# Patient Record
Sex: Female | Born: 1955 | Race: White | Hispanic: No | Smoking: Never smoker
Health system: Southern US, Community
[De-identification: ages and names within clinical notes are randomized; demographics above are authoritative.]

## PROBLEM LIST (undated history)

## (undated) DIAGNOSIS — F32A Depression, unspecified: Secondary | ICD-10-CM

## (undated) DIAGNOSIS — R000 Tachycardia, unspecified: Secondary | ICD-10-CM

## (undated) DIAGNOSIS — E039 Hypothyroidism, unspecified: Secondary | ICD-10-CM

## (undated) DIAGNOSIS — E785 Hyperlipidemia, unspecified: Secondary | ICD-10-CM

## (undated) DIAGNOSIS — F419 Anxiety disorder, unspecified: Secondary | ICD-10-CM

## (undated) DIAGNOSIS — N3281 Overactive bladder: Secondary | ICD-10-CM

## (undated) DIAGNOSIS — I10 Essential (primary) hypertension: Secondary | ICD-10-CM

## (undated) DIAGNOSIS — E119 Type 2 diabetes mellitus without complications: Secondary | ICD-10-CM

## (undated) DIAGNOSIS — M1990 Unspecified osteoarthritis, unspecified site: Secondary | ICD-10-CM

## (undated) DIAGNOSIS — K589 Irritable bowel syndrome without diarrhea: Secondary | ICD-10-CM

## (undated) DIAGNOSIS — E669 Obesity, unspecified: Secondary | ICD-10-CM

## (undated) DIAGNOSIS — E786 Lipoprotein deficiency: Secondary | ICD-10-CM

## (undated) DIAGNOSIS — K219 Gastro-esophageal reflux disease without esophagitis: Secondary | ICD-10-CM

## (undated) DIAGNOSIS — F329 Major depressive disorder, single episode, unspecified: Secondary | ICD-10-CM

## (undated) DIAGNOSIS — T7840XA Allergy, unspecified, initial encounter: Secondary | ICD-10-CM

## (undated) DIAGNOSIS — G43909 Migraine, unspecified, not intractable, without status migrainosus: Secondary | ICD-10-CM

## (undated) HISTORY — PX: FRACTURE SURGERY: SHX138

## (undated) HISTORY — DX: Gastro-esophageal reflux disease without esophagitis: K21.9

## (undated) HISTORY — DX: Hypothyroidism, unspecified: E03.9

## (undated) HISTORY — DX: Irritable bowel syndrome, unspecified: K58.9

## (undated) HISTORY — DX: Allergy, unspecified, initial encounter: T78.40XA

## (undated) HISTORY — PX: TONSILLECTOMY: SUR1361

## (undated) HISTORY — PX: CHOLECYSTECTOMY: SHX55

## (undated) HISTORY — DX: Obesity, unspecified: E66.9

## (undated) HISTORY — PX: ABDOMINAL HYSTERECTOMY: SHX81

## (undated) HISTORY — DX: Major depressive disorder, single episode, unspecified: F32.9

## (undated) HISTORY — DX: Lipoprotein deficiency: E78.6

## (undated) HISTORY — DX: Overactive bladder: N32.81

## (undated) HISTORY — PX: BREAST BIOPSY: SHX20

## (undated) HISTORY — PX: BREAST EXCISIONAL BIOPSY: SUR124

## (undated) HISTORY — DX: Tachycardia, unspecified: R00.0

## (undated) HISTORY — DX: Type 2 diabetes mellitus without complications: E11.9

## (undated) HISTORY — DX: Migraine, unspecified, not intractable, without status migrainosus: G43.909

## (undated) HISTORY — DX: Depression, unspecified: F32.A

---

## 1998-10-03 ENCOUNTER — Other Ambulatory Visit: Admission: RE | Admit: 1998-10-03 | Discharge: 1998-10-03 | Payer: Self-pay | Admitting: Obstetrics & Gynecology

## 1998-11-30 ENCOUNTER — Ambulatory Visit (HOSPITAL_COMMUNITY): Admission: RE | Admit: 1998-11-30 | Discharge: 1998-11-30 | Payer: Self-pay | Admitting: Obstetrics & Gynecology

## 1998-11-30 ENCOUNTER — Encounter: Payer: Self-pay | Admitting: Obstetrics & Gynecology

## 1999-12-06 ENCOUNTER — Encounter: Payer: Self-pay | Admitting: Obstetrics & Gynecology

## 1999-12-06 ENCOUNTER — Ambulatory Visit (HOSPITAL_COMMUNITY): Admission: RE | Admit: 1999-12-06 | Discharge: 1999-12-06 | Payer: Self-pay | Admitting: Obstetrics & Gynecology

## 1999-12-17 ENCOUNTER — Encounter: Payer: Self-pay | Admitting: Obstetrics & Gynecology

## 1999-12-17 ENCOUNTER — Ambulatory Visit (HOSPITAL_COMMUNITY): Admission: RE | Admit: 1999-12-17 | Discharge: 1999-12-17 | Payer: Self-pay | Admitting: Obstetrics & Gynecology

## 1999-12-19 ENCOUNTER — Other Ambulatory Visit: Admission: RE | Admit: 1999-12-19 | Discharge: 1999-12-19 | Payer: Self-pay | Admitting: Obstetrics & Gynecology

## 1999-12-28 ENCOUNTER — Ambulatory Visit (HOSPITAL_COMMUNITY): Admission: RE | Admit: 1999-12-28 | Discharge: 1999-12-28 | Payer: Self-pay | Admitting: Obstetrics & Gynecology

## 1999-12-28 ENCOUNTER — Encounter (INDEPENDENT_AMBULATORY_CARE_PROVIDER_SITE_OTHER): Payer: Self-pay

## 1999-12-28 ENCOUNTER — Encounter: Payer: Self-pay | Admitting: Obstetrics & Gynecology

## 2000-01-02 ENCOUNTER — Encounter: Payer: Self-pay | Admitting: Obstetrics & Gynecology

## 2000-01-02 ENCOUNTER — Ambulatory Visit (HOSPITAL_COMMUNITY): Admission: RE | Admit: 2000-01-02 | Discharge: 2000-01-02 | Payer: Self-pay | Admitting: Obstetrics & Gynecology

## 2000-01-02 ENCOUNTER — Encounter (INDEPENDENT_AMBULATORY_CARE_PROVIDER_SITE_OTHER): Payer: Self-pay

## 2000-09-11 ENCOUNTER — Encounter (INDEPENDENT_AMBULATORY_CARE_PROVIDER_SITE_OTHER): Payer: Self-pay | Admitting: Specialist

## 2000-09-12 ENCOUNTER — Inpatient Hospital Stay (HOSPITAL_COMMUNITY): Admission: AD | Admit: 2000-09-12 | Discharge: 2000-09-14 | Payer: Self-pay | Admitting: Obstetrics & Gynecology

## 2000-12-18 ENCOUNTER — Encounter: Payer: Self-pay | Admitting: Obstetrics & Gynecology

## 2000-12-18 ENCOUNTER — Ambulatory Visit (HOSPITAL_COMMUNITY): Admission: RE | Admit: 2000-12-18 | Discharge: 2000-12-18 | Payer: Self-pay | Admitting: Obstetrics & Gynecology

## 2001-07-17 ENCOUNTER — Other Ambulatory Visit: Admission: RE | Admit: 2001-07-17 | Discharge: 2001-07-17 | Payer: Self-pay | Admitting: Obstetrics & Gynecology

## 2002-03-31 ENCOUNTER — Encounter: Payer: Self-pay | Admitting: Gastroenterology

## 2002-03-31 ENCOUNTER — Ambulatory Visit (HOSPITAL_COMMUNITY): Admission: RE | Admit: 2002-03-31 | Discharge: 2002-03-31 | Payer: Self-pay | Admitting: Gastroenterology

## 2002-08-12 ENCOUNTER — Other Ambulatory Visit: Admission: RE | Admit: 2002-08-12 | Discharge: 2002-08-12 | Payer: Self-pay | Admitting: Obstetrics & Gynecology

## 2002-09-02 ENCOUNTER — Encounter: Payer: Self-pay | Admitting: Obstetrics & Gynecology

## 2002-09-02 ENCOUNTER — Ambulatory Visit (HOSPITAL_COMMUNITY): Admission: RE | Admit: 2002-09-02 | Discharge: 2002-09-02 | Payer: Self-pay | Admitting: Obstetrics & Gynecology

## 2003-09-05 ENCOUNTER — Encounter: Payer: Self-pay | Admitting: Obstetrics & Gynecology

## 2003-09-05 ENCOUNTER — Ambulatory Visit (HOSPITAL_COMMUNITY): Admission: RE | Admit: 2003-09-05 | Discharge: 2003-09-05 | Payer: Self-pay | Admitting: Obstetrics & Gynecology

## 2003-10-25 ENCOUNTER — Other Ambulatory Visit: Admission: RE | Admit: 2003-10-25 | Discharge: 2003-10-25 | Payer: Self-pay | Admitting: Obstetrics & Gynecology

## 2004-09-18 ENCOUNTER — Ambulatory Visit (HOSPITAL_COMMUNITY): Admission: RE | Admit: 2004-09-18 | Discharge: 2004-09-18 | Payer: Self-pay | Admitting: Obstetrics & Gynecology

## 2005-06-27 ENCOUNTER — Other Ambulatory Visit: Admission: RE | Admit: 2005-06-27 | Discharge: 2005-06-27 | Payer: Self-pay | Admitting: Obstetrics & Gynecology

## 2005-10-25 ENCOUNTER — Ambulatory Visit (HOSPITAL_COMMUNITY): Admission: RE | Admit: 2005-10-25 | Discharge: 2005-10-25 | Payer: Self-pay | Admitting: Obstetrics & Gynecology

## 2006-10-28 ENCOUNTER — Ambulatory Visit (HOSPITAL_COMMUNITY): Admission: RE | Admit: 2006-10-28 | Discharge: 2006-10-28 | Payer: Self-pay | Admitting: Obstetrics & Gynecology

## 2007-11-18 ENCOUNTER — Ambulatory Visit (HOSPITAL_COMMUNITY): Admission: RE | Admit: 2007-11-18 | Discharge: 2007-11-18 | Payer: Self-pay | Admitting: Obstetrics & Gynecology

## 2008-11-22 ENCOUNTER — Ambulatory Visit (HOSPITAL_COMMUNITY): Admission: RE | Admit: 2008-11-22 | Discharge: 2008-11-22 | Payer: Self-pay | Admitting: Obstetrics & Gynecology

## 2009-11-23 ENCOUNTER — Ambulatory Visit (HOSPITAL_COMMUNITY): Admission: RE | Admit: 2009-11-23 | Discharge: 2009-11-23 | Payer: Self-pay | Admitting: Obstetrics & Gynecology

## 2009-11-30 ENCOUNTER — Encounter: Admission: RE | Admit: 2009-11-30 | Discharge: 2009-11-30 | Payer: Self-pay | Admitting: Obstetrics & Gynecology

## 2010-03-13 ENCOUNTER — Encounter: Admission: RE | Admit: 2010-03-13 | Discharge: 2010-03-13 | Payer: Self-pay | Admitting: Obstetrics & Gynecology

## 2010-11-12 ENCOUNTER — Encounter: Admission: RE | Admit: 2010-11-12 | Discharge: 2010-11-12 | Payer: Self-pay | Admitting: Obstetrics & Gynecology

## 2010-12-30 ENCOUNTER — Encounter: Payer: Self-pay | Admitting: Obstetrics & Gynecology

## 2011-04-26 NOTE — H&P (Signed)
[facility]  Patient:    Rachel Nixon, Rachel Nixon                        MRN: [id number] Adm. Date:  09/11/00 Attending:  Freddy Finner, M.D.                         History and Physical  ADMISSION DIAGNOSES:          1. Uterine prolapse.                               2. First-degree cystocele.                               3. First-degree rectocele.  HISTORY OF PRESENT ILLNESS:   The patient is a 55 year old white divorced female, gravida 1, para 1, who has been known to have uterine prolapse now for a significant amount of time.  She states that this has recently become significantly worse.  She feels that this interferes with her ability to empty her bladder, although she has no specific urinary incontinence.  On examination in the office she was found to have uterine prolapse.  She has a first-degree cystocele, first-degree rectocele.  She has requested definitive surgical treatment and is admitted now for total vaginal hysterectomy, anterior and posterior colporrhaphy, and possible sacrospinous ligament suspension.  She has definitively stated that if her ovaries are normal she wishes not to have these removed even though the potential benefits of reducing ovarian cancer risks have been discussed with her, as well as the fact that she is at an age when the ovaries have probably provided most of their useful function.  REVIEW OF SYSTEMS:            Current review of systems is otherwise negative, although she does have symptoms intermittently consistent with irritable bowel syndrome.  There are no cardiopulmonary symptoms.  No other GI symptoms.  PAST MEDICAL HISTORY:         No known significant medical illnesses.  She has had a previous benign breast biopsy.  She has had a tonsillectomy in 1975. She had a vaginal delivery in 1982.  She has had cryocautery of the cervix for cervical dysplasia in 1990, but has had normal Pap smears since that time. She  has never had a blood transfusion.  ALLERGIES:                    ERYTHROMYCIN.  No other known drug allergies.  SOCIAL HISTORY:               She is not a cigarette smoker.  FAMILY HISTORY:               Noncontributory.  PHYSICAL EXAMINATION:  HEENT:                        Grossly within normal limits.  NECK:                         Thyroid gland is not palpably enlarged.  VITAL SIGNS:                  Blood pressure in the office was 90/60.  BREASTS:  Considered to be normal.  There are no palpable masses, no skin change, no nipple discharge.  HEART:                        Normal sinus rhythm without murmurs, rubs, or gallops.  CHEST:                        Clear to auscultation.  ABDOMEN:                      Soft and nontender.  Without appreciable organomegaly or palpable masses.  PELVIC:                       As described above.  Bimanual reveals the uterus to be slightly enlarged, but there are no adnexal masses.  RECTAL:                       Palpably normal, and rectovaginal exam confirms these findings.  ASSESSMENT:                   Uterine prolapse, cystocele, rectocele.  PLAN:                         Total vaginal hysterectomy, possible bilateral salpingo-oophorectomy, possible sacrospinous ligament suspension, and definite anterior and posterior colporrhaphy.  The patient has reviewed a video in the office describing these operative procedures and is prepared to proceed with surgery. DD:  09/10/00 TD:  09/10/00 Job: [id number]

## 2011-04-26 NOTE — Procedures (Signed)
[facility]  Patient:    BRECKEN, WALTH Visit Number: [id number] MRN: [id number]          Service Type: END Location: ENDO Attending Physician:  Nelda Marseille Dictated by:   Petra Kuba, M.D. Proc. Date: 03/31/02 Admit Date:  03/31/2002   CC:         Jethro Bastos, M.D.  Josetta Huddle, DMD, M.D.   Procedure Report  PROCEDURE:  Colonoscopy with foreign body removal.  INDICATION:  Patient with a crown in her abdomen not moving on multiple serial x-rays.  Consent was signed after risks, benefits, methods, and options thoroughly discussed in the office.  MEDICATIONS:  Demerol 100, Versed 10.  DESCRIPTION OF PROCEDURE:  Rectal inspection was pertinent for small external hemorrhoids.  Digital exam was negative.  The pediatric video adjustable colonoscope was inserted and despite a tortuous colon, advanced to the level of the ileocecal valve.  This did require abdominal pressure but no position changes.  We could see the crown sitting where we thought was the cecal pole. We went ahead and advanced the [facility] retrieval basket which easily grabbed this crown, and we slowly withdrew the scope with the crown in the basket slightly in the distance.  We were able to see some of the mucosa but could not suction any of the fluid and when we fell back around a loop of the colon, did not try to readvanced.  On slow withdrawal back to the rectum, no abnormalities were seen.  The scope and the basket and crown were removed.  The crown was recovered.  We went ahead and reinserted the scope and easily advanced to 70 cm.  At that point, the scope began to loop.  We were in the proximal level of the splenic flexure, and we elected to slowly withdraw.  No abnormalities were seen on slow withdrawal of the left side of the colon.  Once back in the rectum, the scope was retroflexed, revealing some tiny internal hemorrhoids. The scope was straightened, air was  suctioned, and the scope removed.  The patient tolerated the procedure well.  There was no obvious immediate complication.  ENDOSCOPIC DIAGNOSES: 1. Internal and external hemorrhoids, small to tiny. 2. Crown in the cecum, status post removal with the [employer]. 3. Tortuous colon but no abnormalities seen on insertion and none on the left    side of the colon in a repeat look.  PLAN:  Happy to see back p.r.n.  Return care to Dr. Dorothe Pea for the customary health care maintenance to include yearly rectals and guaiacs.  Repeat screening probably age 55. Dictated by:   Petra Kuba, M.D. Attending Physician:  Nelda Marseille DD:  03/31/02 TD:  03/31/02 Job: [id number]

## 2011-04-26 NOTE — Op Note (Signed)
[facility]  Patient:    Rachel Nixon, Rachel Nixon                      MRN: [id number] Proc. Date: 09/11/00 Adm. Date:  [id number] Attending:  Minette Headland                           Operative Report  PREOPERATIVE DIAGNOSES:       Uterine prolapse, cystocele and rectocele.  POSTOPERATIVE DIAGNOSES:      Uterine prolapse, cystocele and rectocele.  OPERATIVE PROCEDURE:          Total vaginal hysterectomy, anterior and posterior colporrhaphy.  SURGEON:                      Freddy Finner, M.D.  ASSISTANT:                    Trevor Iha, M.D.  ESTIMATED INTRAOPERATIVE BLOOD LOSS:  200 cc.  ANESTHESIA:                   General endotracheal.  INTRAOPERATIVE COMPLICATIONS:  None.  INDICATIONS:                  Details of the present illness are recorded in the admission note.  DESCRIPTION OF PROCEDURE:     Patient was admitted on the morning of surgery. She was given a bolus of Cefotan IV.  She was placed in PAS hose.  She was brought to the operating room and there, placed under adequate general endotracheal anesthesia and placed in the dorsal lithotomy position using the [place] stirrup system.  Betadine prep of perineum, medial thighs, lower abdomen and vagina was carried out with Betadine scrub, followed by a Betadine solution.  Sterile drapes were applied.  A posterior weighted vaginal retractor was placed.  Cervix was grasped with a Christella Hartigan tenaculum.  Cul-de-sac was tented with an Allis clamp and incised with Mayo scissors.  Peritoneum was entered posteriorly.  Cervix was circumscribed with a scalpel to release the mucosa from the cervix.  Curved Heaneys were used to develop the uterosacral pedicles, which were cross-clamped, divided sharply and ligated with 0 Vicryl in a Heaney fashion.  The bladder pillars were developed separately, cross-clamped, divided and ligated with 0 Monocryl.  Anterior peritoneum was entered.  Cardinal ligament  pedicles were taken with curved Heaneys on each side, divided sharply and ligated with 0 Vicryl.  Vascular pedicles were taken, divided sharply and ligated with 0 Monocryl.  Uterus was delivered through the vaginal introitus.  Near ovarian pedicles on each side were cross-clamped with Heaneys, divided sharply and the uterus removed.  Each of these pedicles was doubly ligated with a free tie of 0 Monocryl, followed by suture ligature of 0 Monocryl.  A vessel was noted on the left side proximal to the uteroovarian pedicle and this was clamped and free-tied with 0 Monocryl; this produced complete hemostasis.  Tubes and ovaries were inspected and were found to be normal although somewhat small, consistent with the patients age of 77.  They were left in place, as this was her specific request.  Angles of the vagina were then anchored to the uterosacral pedicles with 0 Monocryl in a mattress pattern.  Uterosacrals were plicated and posterior peritoneum closed with an interrupted 0 Monocryl suture.  Cuff was closed over its posterior two-thirds with figure-of-eights of 0  Monocryl. Edges of the anterior mucosa were then grasped with Allises.  The mucosa overlying the bladder was tented with an Allis in the midline and with progressive sharp and blunt dissection and an incision in the midline, the vesicovaginal fascia was developed and the mucosa separated.  Plication sutures of 0 Monocryl were then used to reduce the cystocele and to create urethrovesical angle at a distance of approximately 2.5 to 3.0 cm, as measured by a clean Kelly in the urethra.  Segments of mucosa was excised.  Cuff was closed with a figure-of-eight of 0 Monocryl.  The anterior incision was closed with a running stitch of 2-0 Monocryl.  Attention was then turned posteriorly. Mucosa was grasped at the introitus on either side with Allises.  A pyramidal-shaped segment of skin was excised from the perineal body.   Mucosa overlying the rectum was freed up with blunt and sharp dissection and an incision made in the midline for a distance of approximately 6 to 8 cm.  With careful sharp and blunt dissection, the pararectal tissues were separated from the mucosa.  Plication sutures of 0 Monocryl were used to re-create a rectovaginal shelf with the stronger perirectal tissue.  The levators were approximated with an interrupted 0 Monocryl.  The perineal body was approximated with 0 Monocryl.  Small segments of mucosa was excised.  The mucosa and perineum were then closed with a running 2-0 Monocryl in a fashion identical to an episiotomy repair.  Bladder was filled with 300 cc of sterile solution and a suprapubic Bonnano catheter placed without difficulty; this was anchored to the skin with 3-0 nylon.  Bladder was then evacuated.  Two-inch Iodoform gauze pack was placed in the vagina.  Procedure was terminated at this point.  All pack, needle and instrument counts were correct.  Patient was taken to recovery in good condition. DD:  09/11/00 TD:  09/11/00 Job: [id number]

## 2011-04-26 NOTE — Discharge Summary (Signed)
[facility]  Patient:    Rachel Nixon, Rachel Nixon                      MRN: [id number] Adm. Date:  [id number] Disc. Date: [id number] Attending:  Minette Headland                           Discharge Summary  DISCHARGE DIAGNOSES:          1. Uterine prolapse.                               2. Uterine leiomyomata.                               3. First degree cystocele.                               4. First degree rectocele.  OPERATIVE PROCEDURE:          Total vaginal hysterectomy, anterior and posterior colporrhaphy.  POSTOPERATIVE COMPLICATIONS:  None.  DISPOSITION:                  Patient is in satisfactory, improved condition at the time of her discharge.  She was voiding greater than 100 cc with residuals at less than or equal to 25 cc on the third postoperative day and at that time, the catheter was removed.  The site was clear.  She was discharged home with instructions to call for urinary retention, for fever, for bleeding.  DIET:                         She is to take a regular diet.  MEDICATIONS:                  She is to take Percocet 5 mg as needed for pain. She is to take her own Topamax and her own Wellbutrin 150 SR b.i.d.  SPECIAL INSTRUCTIONS:         She is to have no vaginal entry.  ACTIVITY:                     She is to have grossly increasing activity.  Details of the present illness, past history, family history, review of systems, and physical exam are according to the admission note.  PHYSICAL EXAMINATION:         Briefly the patients physical findings were remarkable only for the pelvic findings and these were described in the diagnosis above.  LABORATORY DATA:              Laboratory data during this admission includes a normal prothrombin time, PTT, and urinalysis.  On admission hemoglobin was 13.2.  Hemoglobin was 11.2 on both the first and second postoperative days.  HOSPITAL COURSE:              Patient was admitted  on the morning of surgery. She was treated perioperatively with Ceftin and PASO.  She had the above described surgical procedure without significant intraoperative difficulty or complications.  Her postoperative course was uncomplicated.  She remained afebrile throughout her postoperative stay and by the morning of the third postoperative day, her condition was good and she was discharged  with disposition as noted above. DD:  10/16/00 TD:  10/16/00 Job: [id number]

## 2011-11-19 ENCOUNTER — Other Ambulatory Visit: Payer: Self-pay | Admitting: Obstetrics & Gynecology

## 2011-11-19 DIAGNOSIS — Z1231 Encounter for screening mammogram for malignant neoplasm of breast: Secondary | ICD-10-CM

## 2011-12-11 ENCOUNTER — Ambulatory Visit: Payer: Self-pay

## 2011-12-23 ENCOUNTER — Ambulatory Visit: Payer: Self-pay

## 2012-01-20 ENCOUNTER — Ambulatory Visit: Payer: Self-pay

## 2012-02-24 ENCOUNTER — Other Ambulatory Visit: Payer: Self-pay | Admitting: Obstetrics & Gynecology

## 2012-02-24 ENCOUNTER — Ambulatory Visit
Admission: RE | Admit: 2012-02-24 | Discharge: 2012-02-24 | Disposition: A | Discharge status: Home / Self Care | Payer: BC Managed Care – PPO | Source: Ambulatory Visit | Attending: Obstetrics & Gynecology | Admitting: Obstetrics & Gynecology

## 2012-02-24 DIAGNOSIS — Z1231 Encounter for screening mammogram for malignant neoplasm of breast: Secondary | ICD-10-CM

## 2012-02-24 DIAGNOSIS — N63 Unspecified lump in unspecified breast: Secondary | ICD-10-CM

## 2012-02-28 ENCOUNTER — Other Ambulatory Visit: Payer: Self-pay | Admitting: Obstetrics & Gynecology

## 2012-02-28 ENCOUNTER — Ambulatory Visit
Admission: RE | Admit: 2012-02-28 | Discharge: 2012-02-28 | Disposition: A | Discharge status: Home / Self Care | Payer: BC Managed Care – PPO | Source: Ambulatory Visit | Attending: Obstetrics & Gynecology | Admitting: Obstetrics & Gynecology

## 2012-02-28 DIAGNOSIS — N63 Unspecified lump in unspecified breast: Secondary | ICD-10-CM

## 2012-12-22 ENCOUNTER — Observation Stay (HOSPITAL_COMMUNITY)
Admission: EM | Admit: 2012-12-22 | Discharge: 2012-12-23 | Disposition: A | Discharge status: Home / Self Care | Payer: BC Managed Care – PPO | Attending: Internal Medicine | Admitting: Internal Medicine

## 2012-12-22 ENCOUNTER — Encounter (HOSPITAL_COMMUNITY): Payer: Self-pay | Admitting: Emergency Medicine

## 2012-12-22 DIAGNOSIS — R079 Chest pain, unspecified: Principal | ICD-10-CM | POA: Insufficient documentation

## 2012-12-22 DIAGNOSIS — E785 Hyperlipidemia, unspecified: Secondary | ICD-10-CM | POA: Insufficient documentation

## 2012-12-22 DIAGNOSIS — I10 Essential (primary) hypertension: Secondary | ICD-10-CM | POA: Insufficient documentation

## 2012-12-22 HISTORY — DX: Hyperlipidemia, unspecified: E78.5

## 2012-12-22 HISTORY — DX: Essential (primary) hypertension: I10

## 2012-12-22 NOTE — ED Notes (Signed)
Pt alert, arrives from home, c/o left sided chest pain, onset was this evening, states pain started in shoulder, radiates to left chest wall, left arm, describes as dull ache, denies n/v, resp even unlabored, skin pwd

## 2012-12-22 NOTE — ED Notes (Signed)
Pt denies all chest pain. Onset of chest pain was shortly after dinner. Pt took a baby aspirin at 1030. Pt had already taken her scheduled aspirin at just before 9 pm

## 2012-12-23 ENCOUNTER — Emergency Department (HOSPITAL_COMMUNITY): Payer: BC Managed Care – PPO

## 2012-12-23 ENCOUNTER — Encounter (HOSPITAL_COMMUNITY): Payer: Self-pay | Admitting: Internal Medicine

## 2012-12-23 DIAGNOSIS — E785 Hyperlipidemia, unspecified: Secondary | ICD-10-CM

## 2012-12-23 DIAGNOSIS — R079 Chest pain, unspecified: Principal | ICD-10-CM

## 2012-12-23 DIAGNOSIS — R6889 Other general symptoms and signs: Secondary | ICD-10-CM

## 2012-12-23 DIAGNOSIS — I10 Essential (primary) hypertension: Secondary | ICD-10-CM | POA: Diagnosis present

## 2012-12-23 LAB — HEPATIC FUNCTION PANEL
AST: 19 U/L (ref 0–37)
Albumin: 3.1 g/dL — ABNORMAL LOW (ref 3.5–5.2)
Alkaline Phosphatase: 130 U/L — ABNORMAL HIGH (ref 39–117)
Total Bilirubin: 0.2 mg/dL — ABNORMAL LOW (ref 0.3–1.2)

## 2012-12-23 LAB — BASIC METABOLIC PANEL
BUN: 17 mg/dL (ref 6–23)
BUN: 17 mg/dL (ref 6–23)
CO2: 26 mEq/L (ref 19–32)
CO2: 27 mEq/L (ref 19–32)
Calcium: 9.1 mg/dL (ref 8.4–10.5)
Calcium: 9.5 mg/dL (ref 8.4–10.5)
Chloride: 101 mEq/L (ref 96–112)
Creatinine, Ser: 0.66 mg/dL (ref 0.50–1.10)
Creatinine, Ser: 0.71 mg/dL (ref 0.50–1.10)
GFR calc Af Amer: >90 mL/min (ref >90)
GFR calc non Af Amer: >90 mL/min (ref >90)
Glucose, Bld: 109 mg/dL — ABNORMAL HIGH (ref 70–99)
Glucose, Bld: 134 mg/dL — ABNORMAL HIGH (ref 70–99)
Potassium: 3.7 mEq/L (ref 3.5–5.1)
Sodium: 138 mEq/L (ref 135–145)

## 2012-12-23 LAB — CBC
HCT: 39.6 % (ref 36.0–46.0)
MCH: 25.3 pg — ABNORMAL LOW (ref 26.0–34.0)
MCH: 25.9 pg — ABNORMAL LOW (ref 26.0–34.0)
MCV: 79.5 fL (ref 78.0–100.0)
MCV: 79.5 fL (ref 78.0–100.0)
Platelets: 244 10*3/uL (ref 150–400)
Platelets: 276 10*3/uL (ref 150–400)
RBC: 4.98 MIL/uL (ref 3.87–5.11)
RDW: 13.8 % (ref 11.5–15.5)
RDW: 13.9 % (ref 11.5–15.5)
WBC: 9.6 10*3/uL (ref 4.0–10.5)

## 2012-12-23 LAB — POCT I-STAT TROPONIN I: Troponin i, poc: 0 ng/mL (ref 0.00–0.08)

## 2012-12-23 LAB — TROPONIN I: Troponin I: <0.3 ng/mL (ref <0.30)

## 2012-12-23 MED ORDER — ATORVASTATIN CALCIUM 20 MG PO TABS
20.0000 mg | ORAL_TABLET | Freq: Every day | ORAL | Status: DC
  Filled 2012-12-23: qty 1

## 2012-12-23 MED ORDER — ONDANSETRON HCL 4 MG PO TABS: 4.0000 mg | ORAL_TABLET | Freq: Four times a day (QID) | ORAL | Status: DC | PRN

## 2012-12-23 MED ORDER — ACETAMINOPHEN 325 MG PO TABS: 650.0000 mg | ORAL_TABLET | Freq: Four times a day (QID) | ORAL | Status: DC | PRN

## 2012-12-23 MED ORDER — SODIUM CHLORIDE 0.9 % IJ SOLN
3.0000 mL | Freq: Two times a day (BID) | INTRAMUSCULAR | Status: DC
  Administered 2012-12-23: 3 mL via INTRAVENOUS

## 2012-12-23 MED ORDER — ACETAMINOPHEN 650 MG RE SUPP: 650.0000 mg | Freq: Four times a day (QID) | RECTAL | Status: DC | PRN

## 2012-12-23 MED ORDER — PANTOPRAZOLE SODIUM 40 MG PO TBEC
40.0000 mg | DELAYED_RELEASE_TABLET | Freq: Every day | ORAL | Status: DC
  Administered 2012-12-23: 40 mg via ORAL
  Filled 2012-12-23: qty 1

## 2012-12-23 MED ORDER — ONDANSETRON HCL 4 MG/2ML IJ SOLN: 4.0000 mg | Freq: Four times a day (QID) | INTRAMUSCULAR | Status: DC | PRN

## 2012-12-23 MED ORDER — NEBIVOLOL HCL 5 MG PO TABS
5.0000 mg | ORAL_TABLET | Freq: Every day | ORAL | Status: DC
  Administered 2012-12-23: 5 mg via ORAL
  Filled 2012-12-23: qty 1

## 2012-12-23 MED ORDER — ASPIRIN EC 325 MG PO TBEC
325.0000 mg | DELAYED_RELEASE_TABLET | Freq: Every day | ORAL | Status: DC
  Filled 2012-12-23: qty 1

## 2012-12-23 MED ORDER — BUPROPION HCL ER (XL) 150 MG PO TB24
150.0000 mg | ORAL_TABLET | Freq: Every day | ORAL | Status: DC
  Filled 2012-12-23: qty 1

## 2012-12-23 MED ORDER — ENOXAPARIN SODIUM 40 MG/0.4ML ~~LOC~~ SOLN
40.0000 mg | SUBCUTANEOUS | Status: DC
  Administered 2012-12-23: 40 mg via SUBCUTANEOUS
  Filled 2012-12-23: qty 0.4

## 2012-12-23 MED ORDER — SODIUM CHLORIDE 0.9 % IV SOLN
INTRAVENOUS | Status: AC
  Administered 2012-12-23: 03:00:00 via INTRAVENOUS

## 2012-12-23 MED ORDER — ASPIRIN 81 MG PO CHEW
162.0000 mg | CHEWABLE_TABLET | Freq: Once | ORAL | Status: AC
  Administered 2012-12-23: 162 mg via ORAL
  Filled 2012-12-23: qty 2

## 2012-12-23 NOTE — H&P (Signed)
Rachel Nixon is an 57 y.o. female.  Patient was seen and examined on December 23, 2012. PCP - Dr. Shaune Pollack.  Chief Complaint: Chest pain. HPI: 57 year-old female with history of hypertension hyperlipidemia presented to the ER with complaints of chest pain. Patient started developing upper back pain around 7:30 PM after she had dinner restaurant. The pain later moved to the shoulder and then to the chest. At this point she came to the ER and the pain subsequently resolved by itself. Denies any associated shortness of breath fever chills or productive cough. In the ER EKG chest x-ray and cardiac enzymes were unremarkable and patient has been admitted for further management.  Past Medical History  Diagnosis Date  . Hypertension   . Hyperlipidemia     Past Surgical History  Procedure Date  . Abdominal hysterectomy   . Cholecystectomy   . Tonsillectomy   . Fracture surgery     Family History  Problem Relation Age of Onset  . CAD Father   . Diabetes type II Brother    Social History:  reports that she has never smoked. She does not have any smokeless tobacco history on file. She reports that she does not drink alcohol. Her drug history not on file.  Allergies:  Allergies  Allergen Reactions  . Erythromycin   . Lodine (Etodolac)      (Not in a hospital admission)  Results for orders placed during the hospital encounter of 12/22/12 (from the past 48 hour(s))  CBC     Status: Abnormal   Collection Time   12/22/12 11:57 PM      Component Value Range Comment   WBC 9.6  4.0 - 10.5 K/uL    RBC 4.98  3.87 - 5.11 MIL/uL    Hemoglobin 12.9  12.0 - 15.0 g/dL    HCT 16.1  09.6 - 04.5 %    MCV 79.5  78.0 - 100.0 fL    MCH 25.9 (*) 26.0 - 34.0 pg    MCHC 32.6  30.0 - 36.0 g/dL    RDW 40.9  81.1 - 91.4 %    Platelets 276  150 - 400 K/uL   BASIC METABOLIC PANEL     Status: Abnormal   Collection Time   12/22/12 11:57 PM      Component Value Range Comment   Sodium 138  135 - 145  mEq/L    Potassium 3.7  3.5 - 5.1 mEq/L    Chloride 101  96 - 112 mEq/L    CO2 26  19 - 32 mEq/L    Glucose, Bld 134 (*) 70 - 99 mg/dL    BUN 17  6 - 23 mg/dL    Creatinine, Ser 7.82  0.50 - 1.10 mg/dL    Calcium 9.1  8.4 - 95.6 mg/dL    GFR calc non Af Amer >90  >90 mL/min    GFR calc Af Amer >90  >90 mL/min   POCT I-STAT TROPONIN I     Status: Normal   Collection Time   12/23/12 12:03 AM      Component Value Range Comment   Troponin i, poc 0.00  0.00 - 0.08 ng/mL    Comment 3             Dg Chest 2 View  12/23/2012  *RADIOLOGY REPORT*  Clinical Data: Mid chest pain for 24 hours.  CHEST - 2 VIEW  Comparison: None.  Findings: The heart size and pulmonary vascularity are  normal. The lungs appear clear and expanded without focal air space disease or consolidation. No blunting of the costophrenic angles.  No pneumothorax.  Mediastinal contours appear intact.  Small esophageal hiatal hernia behind the heart.  Mild degenerative changes in the spine.  Surgical clips in the right upper quadrant.  IMPRESSION: No evidence of active pulmonary disease.  Small esophageal hiatal hernia.   Original Report Authenticated By: Burman Nieves, M.D.     Review of Systems  Constitutional: Negative.   HENT: Negative.   Eyes: Negative.   Respiratory: Negative.   Cardiovascular: Positive for chest pain.  Gastrointestinal: Negative.   Genitourinary: Negative.   Musculoskeletal: Negative.   Skin: Negative.   Neurological: Negative.   Endo/Heme/Allergies: Negative.   Psychiatric/Behavioral: Negative.     Blood pressure 149/99, pulse 79, temperature 98 F (36.7 C), temperature source Oral, resp. rate 16, weight 104.327 kg (230 lb), SpO2 99.00%. Physical Exam  Constitutional: She is oriented to person, place, and time. She appears well-developed and well-nourished. No distress.  HENT:  Head: Normocephalic and atraumatic.  Right Ear: External ear normal.  Left Ear: External ear normal.  Nose: Nose  normal.  Mouth/Throat: Oropharynx is clear and moist. No oropharyngeal exudate.  Eyes: Conjunctivae normal are normal. Pupils are equal, round, and reactive to light. Right eye exhibits no discharge. Left eye exhibits no discharge. No scleral icterus.  Neck: Normal range of motion. Neck supple.  Cardiovascular: Normal rate and regular rhythm.   Respiratory: Effort normal and breath sounds normal. No respiratory distress. She has no wheezes. She has no rales.  GI: Soft. Bowel sounds are normal. She exhibits no distension. There is no tenderness. There is no rebound.  Musculoskeletal: She exhibits no edema and no tenderness.  Neurological: She is alert and oriented to person, place, and time.       Moves all extremities.  Skin: Skin is warm and dry. She is not diaphoretic.  Psychiatric: Her behavior is normal.     Assessment/Plan #1. Chest pain - cycle cardiac markers. Aspirin. When necessary nitroglycerin for any chest pain. Check d-dimer. #2. Hypertension - continue home medications. #3. Hyperlipidemia - continue present medications.  CODE STATUS - full code.  KAKRAKANDY,ARSHAD N. 12/23/2012, 2:18 AM

## 2012-12-23 NOTE — ED Provider Notes (Addendum)
History     CSN: [id number]  Arrival date & time 12/22/12  2306   First MD Initiated Contact with Patient 12/23/12 0035      Chief Complaint  Patient presents with  . Chest Pain    (Consider location/radiation/quality/duration/timing/severity/associated sxs/prior treatment) HPI 57 year old female presents emergency part complaining of chest pain. She reports she had onset of left posterior back pain and arm pain starting about an hour after dinner around 7:30 tonight. These pains lasted for about an hour intermittently. Then she had onset of left-sided dull chest pain that lasted from 8:30 until around 11 PM. She denies any nausea no diaphoresis no shortness of breath. She's never had this same pain before. She has history of reflux, and denies this pain was similar to that. Patient has history of hypertension, hyperlipidemia. She is strong family history as her father has had several MIs and is status post CABG. He began to have heart troubles in his 49s. Patient does not smoke. She denies any leg swelling no PE risk factors. She's never been evaluated for chest pain before Past Medical History  Diagnosis Date  . Hypertension     Past Surgical History  Procedure Date  . Abdominal hysterectomy   . Cholecystectomy   . Tonsillectomy   . Fracture surgery     No family history on file.  History  Substance Use Topics  . Smoking status: Never Smoker   . Smokeless tobacco: Not on file  . Alcohol Use: No    OB History    Grav Para Term Preterm Abortions TAB SAB Ect Mult Living                  Review of Systems  See History of Present Illness; otherwise all other systems are reviewed and negative  Allergies  Erythromycin and Lodine  Home Medications   Current Outpatient Rx  Name  Route  Sig  Dispense  Refill  . ASPIRIN 81 MG PO CHEW   Oral   Chew 81 mg by mouth daily.         . ATORVASTATIN CALCIUM 20 MG PO TABS   Oral   Take 20 mg by mouth daily.         .  BUPROPION HCL ER (XL) 150 MG PO TB24   Oral   Take 150 mg by mouth daily.         . ERGOCALCIFEROL 50000 UNITS PO CAPS   Oral   Take 50,000 Units by mouth 2 (two) times a week. Sunday and thursday         . NEBIVOLOL HCL 5 MG PO TABS   Oral   Take 5 mg by mouth daily.         Marland Kitchen OMEPRAZOLE 10 MG PO CPDR   Oral   Take 10 mg by mouth daily.           BP 149/99  Pulse 79  Temp 98 F (36.7 C) (Oral)  Resp 16  Wt 230 lb (104.327 kg)  SpO2 99%  Physical Exam  Nursing note and vitals reviewed. Constitutional: She is oriented to person, place, and time. She appears well-developed and well-nourished.  HENT:  Head: Normocephalic and atraumatic.  Nose: Nose normal.  Mouth/Throat: Oropharynx is clear and moist.  Eyes: Conjunctivae normal and EOM are normal. Pupils are equal, round, and reactive to light.  Neck: Normal range of motion. Neck supple. No JVD present. No tracheal deviation present. No thyromegaly present.  Cardiovascular:  Normal rate, regular rhythm, normal heart sounds and intact distal pulses.  Exam reveals no gallop and no friction rub.   No murmur heard. Pulmonary/Chest: Effort normal and breath sounds normal. No stridor. No respiratory distress. She has no wheezes. She has no rales. She exhibits no tenderness.  Abdominal: Soft. Bowel sounds are normal. She exhibits no distension and no mass. There is no tenderness. There is no rebound and no guarding.  Musculoskeletal: Normal range of motion. She exhibits no edema and no tenderness.  Lymphadenopathy:    She has no cervical adenopathy.  Neurological: She is alert and oriented to person, place, and time. She exhibits normal muscle tone. Coordination normal.  Skin: Skin is warm and dry. No rash noted. No erythema. No pallor.  Psychiatric: She has a normal mood and affect. Her behavior is normal. Judgment and thought content normal.    ED Course  Procedures (including critical care time)  Labs Reviewed  CBC  - Abnormal; Notable for the following:    MCH 25.9 (*)     All other components within normal limits  BASIC METABOLIC PANEL - Abnormal; Notable for the following:    Glucose, Bld 134 (*)     All other components within normal limits  POCT I-STAT TROPONIN I   No results found.   Date: 12/22/2012  Rate: 81  Rhythm: normal sinus rhythm  QRS Axis: normal  Intervals: normal  ST/T Wave abnormalities: normal  Conduction Disutrbances:none  Narrative Interpretation:   Old EKG Reviewed: none available    1. Chest pain   2. Hypertension       MDM  57 year old female with chest pain. She has multiple risk factors for coronary disease. Patient has taken half dose of aspirin, will give full dose check chest x-ray. Given her risk factors I feel she needs admission for further evaluation.        Olivia Mackie, MD 12/23/12 1610  Olivia Mackie, MD 12/23/12 [phone]

## 2012-12-23 NOTE — Progress Notes (Signed)
Patient seen and examined by me.  Watch on tele.  Cycle CE- if negative plan to d/c home this PM Negative d dimer  Rondale Nies DO

## 2012-12-23 NOTE — Discharge Summary (Signed)
Physician Discharge Summary  Rachel Nixon ZOX:[id number] DOB: 1956/07/29 DOA: 12/22/2012  PCP: No primary provider on file.  Admit date: 12/22/2012 Discharge date: 12/23/2012  Time spent: 35 minutes  Recommendations for Outpatient Follow-up:  1.   Discharge Diagnoses:  Principal Problem:  *Chest pain Active Problems:  HTN (hypertension)  Hyperlipidemia   Discharge Condition: improved  Diet recommendation: cardac  Filed Weights   12/22/12 2313 12/23/12 0313 12/23/12 0500  Weight: 104.327 kg (230 lb) 106.8 kg (235 lb 7.2 oz) 106.7 kg (235 lb 3.7 oz)    History of present illness:  57 year-old female with history of hypertension hyperlipidemia presented to the ER with complaints of chest pain. Patient started developing upper back pain around 7:30 PM after she had dinner restaurant. The pain later moved to the shoulder and then to the chest. At this point she came to the ER and the pain subsequently resolved by itself. Denies any associated shortness of breath fever chills or productive cough. In the ER EKG chest x-ray and cardiac enzymes were unremarkable and patient has been admitted for further management   Hospital Course:  Chest pain - cycle cardiac markers. Aspirin. CE and EKG ok, d dimer low  D/c home to follow with PCP.   Hypertension - continue home medications.   Hyperlipidemia - continue present medications   Procedures:  none  Consultations:  none  Discharge Exam: Filed Vitals:   12/23/12 0100 12/23/12 0313 12/23/12 0500 12/23/12 0721  BP: 127/76 134/91  113/65  Pulse:  77  77  Temp:  98.1 F (36.7 C)  98 F (36.7 C)  TempSrc:  Oral  Oral  Resp: 14 16  14   Height:  5' 3.5" (1.613 m)    Weight:  106.8 kg (235 lb 7.2 oz) 106.7 kg (235 lb 3.7 oz)   SpO2: 90% 97%  98%    General: A+Ox3, NAd Cardiovascular: rrr Respiratory: clear  Discharge Instructions     Medication List     As of 12/23/2012 10:35 AM    ASK your doctor about these  medications         aspirin 81 MG chewable tablet   Chew 81 mg by mouth daily.      atorvastatin 20 MG tablet   Commonly known as: LIPITOR   Take 20 mg by mouth daily.      buPROPion 150 MG 24 hr tablet   Commonly known as: WELLBUTRIN XL   Take 150 mg by mouth daily.      ergocalciferol 50000 UNITS capsule   Commonly known as: VITAMIN D2   Take 50,000 Units by mouth 2 (two) times a week. Sunday and thursday      nebivolol 5 MG tablet   Commonly known as: BYSTOLIC   Take 5 mg by mouth daily.      omeprazole 10 MG capsule   Commonly known as: PRILOSEC   Take 10 mg by mouth daily.          The results of significant diagnostics from this hospitalization (including imaging, microbiology, ancillary and laboratory) are listed below for reference.    Significant Diagnostic Studies: Dg Chest 2 View  12/23/2012  *RADIOLOGY REPORT*  Clinical Data: Mid chest pain for 24 hours.  CHEST - 2 VIEW  Comparison: None.  Findings: The heart size and pulmonary vascularity are normal. The lungs appear clear and expanded without focal air space disease or consolidation. No blunting of the costophrenic angles.  No pneumothorax.  Mediastinal contours  appear intact.  Small esophageal hiatal hernia behind the heart.  Mild degenerative changes in the spine.  Surgical clips in the right upper quadrant.  IMPRESSION: No evidence of active pulmonary disease.  Small esophageal hiatal hernia.   Original Report Authenticated By: Burman Nieves, M.D.     Microbiology: No results found for this or any previous visit (from the past 240 hour(s)).   Labs: Basic Metabolic Panel:  Lab 12/23/12 1610 12/22/12 2357  NA 140 138  K 3.6 3.7  CL 104 101  CO2 27 26  GLUCOSE 109* 134*  BUN 17 17  CREATININE 0.66 0.71  CALCIUM 9.5 9.1  MG -- --  PHOS -- --   Liver Function Tests:  Lab 12/23/12 0444  AST 19  ALT 22  ALKPHOS 130*  BILITOT 0.2*  PROT 6.0  ALBUMIN 3.1*    Lab 12/23/12 0444  LIPASE 36    AMYLASE --   No results found for this basename: AMMONIA:5 in the last 168 hours CBC:  Lab 12/23/12 0444 12/22/12 2357  WBC 8.0 9.6  NEUTROABS -- --  HGB 12.3 12.9  HCT 38.7 39.6  MCV 79.5 79.5  PLT 244 276   Cardiac Enzymes:  Lab 12/23/12 0825 12/23/12 0200  CKTOTAL -- --  CKMB -- --  CKMBINDEX -- --  TROPONINI <0.30 <0.30   BNP: BNP (last 3 results) No results found for this basename: PROBNP:3 in the last 8760 hours CBG: No results found for this basename: GLUCAP:5 in the last 168 hours     Signed:  Marlin Canary  [facility] 12/23/2012, 10:35 AM

## 2013-04-16 ENCOUNTER — Other Ambulatory Visit: Payer: Self-pay

## 2013-04-16 DIAGNOSIS — Z1231 Encounter for screening mammogram for malignant neoplasm of breast: Secondary | ICD-10-CM

## 2013-05-17 ENCOUNTER — Ambulatory Visit: Payer: BC Managed Care – PPO

## 2013-06-21 ENCOUNTER — Ambulatory Visit
Admission: RE | Admit: 2013-06-21 | Discharge: 2013-06-21 | Disposition: A | Discharge status: Home / Self Care | Payer: BC Managed Care – PPO | Source: Ambulatory Visit

## 2013-06-21 DIAGNOSIS — Z1231 Encounter for screening mammogram for malignant neoplasm of breast: Secondary | ICD-10-CM

## 2014-02-08 ENCOUNTER — Ambulatory Visit: Payer: BC Managed Care – PPO

## 2014-02-15 ENCOUNTER — Ambulatory Visit: Payer: BC Managed Care – PPO

## 2014-02-22 ENCOUNTER — Ambulatory Visit: Payer: BC Managed Care – PPO

## 2014-03-01 ENCOUNTER — Encounter: Payer: BC Managed Care – PPO | Attending: Family Medicine

## 2014-03-01 VITALS — Ht 63.0 in | Wt 244.2 lb

## 2014-03-01 DIAGNOSIS — E119 Type 2 diabetes mellitus without complications: Secondary | ICD-10-CM

## 2014-03-01 DIAGNOSIS — Z713 Dietary counseling and surveillance: Secondary | ICD-10-CM | POA: Insufficient documentation

## 2014-03-02 NOTE — Progress Notes (Signed)
Patient was seen on 03/01/14 for the first of a series of three diabetes self-management courses at the [facility].  Current HbA1c: 6.8%  The following learning objectives were met by the patient during this class:  Describe diabetes  State some common risk factors for diabetes  Defines the role of glucose and insulin  Identifies type of diabetes and pathophysiology  Describe the relationship between diabetes and cardiovascular risk  State the members of the Healthcare Team  States the rationale for glucose monitoring  State when to test glucose  State their individual Target Range  State the importance of logging glucose readings  Describe how to interpret glucose readings  Identifies A1C target  Explain the correlation between A1c and eAG values  State symptoms and treatment of high blood glucose  State symptoms and treatment of low blood glucose  Explain proper technique for glucose testing  Identifies proper sharps disposal  Handouts given during class include:  Living Well with Diabetes book  Carb Counting and Meal Planning book  Meal Plan Card  Carbohydrate guide  Meal planning worksheet  Low Sodium Flavoring Tips  The diabetes portion plate  N1G to eAG Conversion Chart  Diabetes Medications  Diabetes Recommended Care Schedule  Support Group  Diabetes Success Plan  Core Class Satisfaction Survey  Follow-Up Plan:  Attend core 2

## 2014-03-08 DIAGNOSIS — E119 Type 2 diabetes mellitus without complications: Secondary | ICD-10-CM

## 2014-03-09 NOTE — Progress Notes (Signed)

## 2014-03-15 ENCOUNTER — Encounter: Payer: BC Managed Care – PPO | Attending: Family Medicine

## 2014-03-15 DIAGNOSIS — Z713 Dietary counseling and surveillance: Secondary | ICD-10-CM | POA: Insufficient documentation

## 2014-03-15 DIAGNOSIS — E119 Type 2 diabetes mellitus without complications: Secondary | ICD-10-CM | POA: Insufficient documentation

## 2014-06-03 ENCOUNTER — Other Ambulatory Visit: Payer: Self-pay

## 2014-06-03 DIAGNOSIS — Z1231 Encounter for screening mammogram for malignant neoplasm of breast: Secondary | ICD-10-CM

## 2014-06-27 ENCOUNTER — Ambulatory Visit
Admission: RE | Admit: 2014-06-27 | Discharge: 2014-06-27 | Disposition: A | Discharge status: Home / Self Care | Payer: BC Managed Care – PPO | Source: Ambulatory Visit

## 2014-06-27 DIAGNOSIS — Z1231 Encounter for screening mammogram for malignant neoplasm of breast: Secondary | ICD-10-CM

## 2014-09-19 ENCOUNTER — Encounter: Payer: Self-pay | Admitting: *Deleted

## 2015-01-10 ENCOUNTER — Other Ambulatory Visit: Payer: Self-pay | Admitting: *Deleted

## 2015-01-10 DIAGNOSIS — R000 Tachycardia, unspecified: Secondary | ICD-10-CM

## 2015-01-16 ENCOUNTER — Other Ambulatory Visit (HOSPITAL_COMMUNITY): Payer: Self-pay

## 2015-01-19 ENCOUNTER — Other Ambulatory Visit (HOSPITAL_COMMUNITY): Payer: Self-pay

## 2015-01-19 ENCOUNTER — Other Ambulatory Visit: Payer: Self-pay

## 2015-05-23 ENCOUNTER — Other Ambulatory Visit: Payer: Self-pay

## 2015-05-23 DIAGNOSIS — Z1231 Encounter for screening mammogram for malignant neoplasm of breast: Secondary | ICD-10-CM

## 2015-07-03 ENCOUNTER — Ambulatory Visit
Admission: RE | Admit: 2015-07-03 | Discharge: 2015-07-03 | Disposition: A | Discharge status: Home / Self Care | Payer: Managed Care, Other (non HMO) | Source: Ambulatory Visit

## 2015-07-03 DIAGNOSIS — Z1231 Encounter for screening mammogram for malignant neoplasm of breast: Secondary | ICD-10-CM

## 2015-07-04 ENCOUNTER — Other Ambulatory Visit: Payer: Self-pay | Admitting: Family Medicine

## 2015-07-04 DIAGNOSIS — R928 Other abnormal and inconclusive findings on diagnostic imaging of breast: Secondary | ICD-10-CM

## 2015-07-10 ENCOUNTER — Ambulatory Visit
Admission: RE | Admit: 2015-07-10 | Discharge: 2015-07-10 | Disposition: A | Discharge status: Home / Self Care | Payer: Managed Care, Other (non HMO) | Source: Ambulatory Visit | Attending: Family Medicine | Admitting: Family Medicine

## 2015-07-10 ENCOUNTER — Other Ambulatory Visit: Payer: Self-pay | Admitting: Family Medicine

## 2015-07-10 DIAGNOSIS — R928 Other abnormal and inconclusive findings on diagnostic imaging of breast: Secondary | ICD-10-CM

## 2015-07-17 ENCOUNTER — Ambulatory Visit
Admission: RE | Admit: 2015-07-17 | Discharge: 2015-07-17 | Disposition: A | Discharge status: Home / Self Care | Payer: Managed Care, Other (non HMO) | Source: Ambulatory Visit | Attending: Family Medicine | Admitting: Family Medicine

## 2015-07-17 ENCOUNTER — Other Ambulatory Visit: Payer: Self-pay | Admitting: Family Medicine

## 2015-07-17 DIAGNOSIS — R928 Other abnormal and inconclusive findings on diagnostic imaging of breast: Secondary | ICD-10-CM

## 2015-12-10 HISTORY — PX: BREAST EXCISIONAL BIOPSY: SUR124

## 2016-02-06 ENCOUNTER — Other Ambulatory Visit: Payer: Self-pay | Admitting: Family Medicine

## 2016-02-06 DIAGNOSIS — R921 Mammographic calcification found on diagnostic imaging of breast: Secondary | ICD-10-CM

## 2016-02-16 ENCOUNTER — Inpatient Hospital Stay: Admission: RE | Admit: 2016-02-16 | Payer: Managed Care, Other (non HMO) | Source: Ambulatory Visit

## 2016-02-22 ENCOUNTER — Ambulatory Visit
Admission: RE | Admit: 2016-02-22 | Discharge: 2016-02-22 | Disposition: A | Discharge status: Home / Self Care | Payer: Managed Care, Other (non HMO) | Source: Ambulatory Visit | Attending: Family Medicine | Admitting: Family Medicine

## 2016-02-22 ENCOUNTER — Other Ambulatory Visit: Payer: Self-pay | Admitting: Family Medicine

## 2016-02-22 DIAGNOSIS — R921 Mammographic calcification found on diagnostic imaging of breast: Secondary | ICD-10-CM

## 2016-03-07 ENCOUNTER — Other Ambulatory Visit: Payer: Self-pay | Admitting: Family Medicine

## 2016-03-07 DIAGNOSIS — R928 Other abnormal and inconclusive findings on diagnostic imaging of breast: Secondary | ICD-10-CM

## 2016-04-01 ENCOUNTER — Inpatient Hospital Stay: Admission: RE | Admit: 2016-04-01 | Payer: Managed Care, Other (non HMO) | Source: Ambulatory Visit

## 2016-04-12 ENCOUNTER — Ambulatory Visit
Admission: RE | Admit: 2016-04-12 | Discharge: 2016-04-12 | Disposition: A | Discharge status: Home / Self Care | Payer: Managed Care, Other (non HMO) | Source: Ambulatory Visit | Attending: Family Medicine | Admitting: Family Medicine

## 2016-04-12 DIAGNOSIS — R928 Other abnormal and inconclusive findings on diagnostic imaging of breast: Secondary | ICD-10-CM

## 2016-04-12 MED ORDER — GADOBENATE DIMEGLUMINE 529 MG/ML IV SOLN
20.0000 mL | Freq: Once | INTRAVENOUS | Status: AC | PRN
  Administered 2016-04-12: 20 mL via INTRAVENOUS

## 2016-04-23 ENCOUNTER — Other Ambulatory Visit: Payer: Self-pay | Admitting: Family Medicine

## 2016-04-23 DIAGNOSIS — N63 Unspecified lump in unspecified breast: Secondary | ICD-10-CM

## 2016-04-23 DIAGNOSIS — N6489 Other specified disorders of breast: Secondary | ICD-10-CM

## 2016-04-30 ENCOUNTER — Other Ambulatory Visit: Payer: Self-pay | Admitting: Family Medicine

## 2016-04-30 DIAGNOSIS — N6489 Other specified disorders of breast: Secondary | ICD-10-CM

## 2016-05-01 ENCOUNTER — Ambulatory Visit
Admission: RE | Admit: 2016-05-01 | Discharge: 2016-05-01 | Disposition: A | Discharge status: Home / Self Care | Payer: Managed Care, Other (non HMO) | Source: Ambulatory Visit | Attending: Family Medicine | Admitting: Family Medicine

## 2016-05-01 ENCOUNTER — Other Ambulatory Visit: Payer: Self-pay | Admitting: Family Medicine

## 2016-05-01 DIAGNOSIS — N6489 Other specified disorders of breast: Secondary | ICD-10-CM

## 2016-05-01 DIAGNOSIS — N63 Unspecified lump in unspecified breast: Secondary | ICD-10-CM

## 2016-05-01 MED ORDER — GADOBENATE DIMEGLUMINE 529 MG/ML IV SOLN
20.0000 mL | Freq: Once | INTRAVENOUS | Status: AC | PRN
  Administered 2016-05-01: 20 mL via INTRAVENOUS

## 2016-05-14 ENCOUNTER — Other Ambulatory Visit: Payer: Self-pay | Admitting: General Surgery

## 2016-05-14 DIAGNOSIS — N6310 Unspecified lump in the right breast, unspecified quadrant: Secondary | ICD-10-CM | POA: Insufficient documentation

## 2016-05-22 ENCOUNTER — Other Ambulatory Visit: Payer: Self-pay | Admitting: General Surgery

## 2016-05-22 DIAGNOSIS — N6310 Unspecified lump in the right breast, unspecified quadrant: Secondary | ICD-10-CM

## 2016-06-05 ENCOUNTER — Encounter (HOSPITAL_BASED_OUTPATIENT_CLINIC_OR_DEPARTMENT_OTHER): Payer: Self-pay | Admitting: *Deleted

## 2016-06-06 ENCOUNTER — Other Ambulatory Visit: Payer: Self-pay

## 2016-06-06 ENCOUNTER — Encounter: Payer: Self-pay | Admitting: Cardiovascular Disease

## 2016-06-06 ENCOUNTER — Encounter (HOSPITAL_BASED_OUTPATIENT_CLINIC_OR_DEPARTMENT_OTHER)
Admission: RE | Admit: 2016-06-06 | Discharge: 2016-06-06 | Disposition: A | Discharge status: Home / Self Care | Payer: Managed Care, Other (non HMO) | Source: Ambulatory Visit | Attending: General Surgery | Admitting: General Surgery

## 2016-06-06 DIAGNOSIS — N6091 Unspecified benign mammary dysplasia of right breast: Secondary | ICD-10-CM | POA: Diagnosis not present

## 2016-06-06 DIAGNOSIS — Z881 Allergy status to other antibiotic agents status: Secondary | ICD-10-CM | POA: Diagnosis not present

## 2016-06-06 DIAGNOSIS — F419 Anxiety disorder, unspecified: Secondary | ICD-10-CM | POA: Diagnosis not present

## 2016-06-06 DIAGNOSIS — N6011 Diffuse cystic mastopathy of right breast: Secondary | ICD-10-CM | POA: Diagnosis not present

## 2016-06-06 DIAGNOSIS — I10 Essential (primary) hypertension: Secondary | ICD-10-CM | POA: Insufficient documentation

## 2016-06-06 DIAGNOSIS — D241 Benign neoplasm of right breast: Secondary | ICD-10-CM | POA: Diagnosis not present

## 2016-06-06 DIAGNOSIS — Z01810 Encounter for preprocedural cardiovascular examination: Secondary | ICD-10-CM | POA: Diagnosis not present

## 2016-06-06 DIAGNOSIS — Z888 Allergy status to other drugs, medicaments and biological substances status: Secondary | ICD-10-CM | POA: Diagnosis not present

## 2016-06-06 DIAGNOSIS — F329 Major depressive disorder, single episode, unspecified: Secondary | ICD-10-CM | POA: Diagnosis not present

## 2016-06-06 DIAGNOSIS — Z7984 Long term (current) use of oral hypoglycemic drugs: Secondary | ICD-10-CM | POA: Diagnosis not present

## 2016-06-06 DIAGNOSIS — E119 Type 2 diabetes mellitus without complications: Secondary | ICD-10-CM | POA: Diagnosis present

## 2016-06-06 DIAGNOSIS — E7800 Pure hypercholesterolemia, unspecified: Secondary | ICD-10-CM | POA: Diagnosis not present

## 2016-06-06 DIAGNOSIS — N63 Unspecified lump in breast: Secondary | ICD-10-CM | POA: Diagnosis present

## 2016-06-06 DIAGNOSIS — K219 Gastro-esophageal reflux disease without esophagitis: Secondary | ICD-10-CM | POA: Diagnosis not present

## 2016-06-06 DIAGNOSIS — Z6839 Body mass index (BMI) 39.0-39.9, adult: Secondary | ICD-10-CM | POA: Diagnosis not present

## 2016-06-06 DIAGNOSIS — Z79899 Other long term (current) drug therapy: Secondary | ICD-10-CM | POA: Diagnosis not present

## 2016-06-06 LAB — CBC WITH DIFFERENTIAL/PLATELET
Basophils Absolute: 0 10*3/uL (ref 0.0–0.1)
Basophils Relative: 0 %
EOS ABS: 0.2 10*3/uL (ref 0.0–0.7)
Eosinophils Relative: 2 %
HEMATOCRIT: 40 % (ref 36.0–46.0)
HEMOGLOBIN: 12.6 g/dL (ref 12.0–15.0)
LYMPHS ABS: 2.2 10*3/uL (ref 0.7–4.0)
LYMPHS PCT: 31 %
MCH: 25 pg — AB (ref 26.0–34.0)
MCHC: 31.5 g/dL (ref 30.0–36.0)
MCV: 79.2 fL (ref 78.0–100.0)
MONOS PCT: 11 %
Monocytes Absolute: 0.8 10*3/uL (ref 0.1–1.0)
NEUTROS PCT: 56 %
Neutro Abs: 4 10*3/uL (ref 1.7–7.7)
Platelets: 280 10*3/uL (ref 150–400)
RBC: 5.05 MIL/uL (ref 3.87–5.11)
RDW: 14.2 % (ref 11.5–15.5)
WBC: 7.2 10*3/uL (ref 4.0–10.5)

## 2016-06-06 LAB — BASIC METABOLIC PANEL
Anion gap: 7 (ref 5–15)
BUN: 14 mg/dL (ref 6–20)
CHLORIDE: 106 mmol/L (ref 101–111)
CO2: 27 mmol/L (ref 22–32)
CREATININE: 0.68 mg/dL (ref 0.44–1.00)
Calcium: 9.4 mg/dL (ref 8.9–10.3)
GFR calc non Af Amer: >60 mL/min (ref >60)
Glucose, Bld: 111 mg/dL — ABNORMAL HIGH (ref 65–99)
Potassium: 5.3 mmol/L — ABNORMAL HIGH (ref 3.5–5.1)
SODIUM: 140 mmol/L (ref 135–145)

## 2016-06-06 NOTE — Pre-Procedure Instructions (Signed)
Dr. Donne Hazel notified of K+ of 5.3 - wants it repeated DOS. Order placed.

## 2016-06-06 NOTE — Pre-Procedure Instructions (Signed)
Patient in for EKG, Bmet and CBC. EKG showed some irregularities. Old EKG and old ER visit pulled up and shown to Dr. Al Corpus for comparison. Dr. Al Corpus noted no changes on the EKG. Dr. Al Corpus stated that patient was ok for surgery. Patient denied cardiac symptoms; SOB, Chest pain with walking or other activities. Patient given a 8 oz water with a fruit punch drink mix to be consumed by 7am on the day of surgery. Patient verbalized an understanding. Sherron Monday, RN BSN CAPA, AD/[facility].

## 2016-06-10 ENCOUNTER — Ambulatory Visit
Admission: RE | Admit: 2016-06-10 | Discharge: 2016-06-10 | Disposition: A | Discharge status: Home / Self Care | Payer: Managed Care, Other (non HMO) | Source: Ambulatory Visit | Attending: General Surgery | Admitting: General Surgery

## 2016-06-10 DIAGNOSIS — N6310 Unspecified lump in the right breast, unspecified quadrant: Secondary | ICD-10-CM

## 2016-06-12 ENCOUNTER — Ambulatory Visit
Admission: RE | Admit: 2016-06-12 | Discharge: 2016-06-12 | Disposition: A | Discharge status: Home / Self Care | Payer: Managed Care, Other (non HMO) | Source: Ambulatory Visit | Attending: General Surgery | Admitting: General Surgery

## 2016-06-12 ENCOUNTER — Encounter (HOSPITAL_BASED_OUTPATIENT_CLINIC_OR_DEPARTMENT_OTHER)
Admission: RE | Disposition: A | Discharge status: Home / Self Care | Payer: Self-pay | Source: Ambulatory Visit | Attending: General Surgery

## 2016-06-12 ENCOUNTER — Ambulatory Visit
Admit: 2016-06-12 | Discharge: 2016-06-12 | Disposition: A | Discharge status: Home / Self Care | Payer: Managed Care, Other (non HMO) | Attending: General Surgery | Admitting: General Surgery

## 2016-06-12 ENCOUNTER — Ambulatory Visit (HOSPITAL_BASED_OUTPATIENT_CLINIC_OR_DEPARTMENT_OTHER)
Admission: RE | Admit: 2016-06-12 | Discharge: 2016-06-12 | Disposition: A | Discharge status: Home / Self Care | Payer: Managed Care, Other (non HMO) | Source: Ambulatory Visit | Attending: General Surgery | Admitting: General Surgery

## 2016-06-12 ENCOUNTER — Encounter (HOSPITAL_BASED_OUTPATIENT_CLINIC_OR_DEPARTMENT_OTHER): Payer: Self-pay | Admitting: Anesthesiology

## 2016-06-12 ENCOUNTER — Ambulatory Visit (HOSPITAL_BASED_OUTPATIENT_CLINIC_OR_DEPARTMENT_OTHER): Payer: Managed Care, Other (non HMO) | Admitting: Anesthesiology

## 2016-06-12 DIAGNOSIS — N6011 Diffuse cystic mastopathy of right breast: Secondary | ICD-10-CM | POA: Insufficient documentation

## 2016-06-12 DIAGNOSIS — F419 Anxiety disorder, unspecified: Secondary | ICD-10-CM | POA: Insufficient documentation

## 2016-06-12 DIAGNOSIS — Z6839 Body mass index (BMI) 39.0-39.9, adult: Secondary | ICD-10-CM | POA: Insufficient documentation

## 2016-06-12 DIAGNOSIS — K219 Gastro-esophageal reflux disease without esophagitis: Secondary | ICD-10-CM | POA: Insufficient documentation

## 2016-06-12 DIAGNOSIS — Z7984 Long term (current) use of oral hypoglycemic drugs: Secondary | ICD-10-CM | POA: Insufficient documentation

## 2016-06-12 DIAGNOSIS — D241 Benign neoplasm of right breast: Secondary | ICD-10-CM | POA: Diagnosis not present

## 2016-06-12 DIAGNOSIS — E7800 Pure hypercholesterolemia, unspecified: Secondary | ICD-10-CM | POA: Insufficient documentation

## 2016-06-12 DIAGNOSIS — E119 Type 2 diabetes mellitus without complications: Secondary | ICD-10-CM | POA: Insufficient documentation

## 2016-06-12 DIAGNOSIS — N6310 Unspecified lump in the right breast, unspecified quadrant: Secondary | ICD-10-CM

## 2016-06-12 DIAGNOSIS — F329 Major depressive disorder, single episode, unspecified: Secondary | ICD-10-CM | POA: Insufficient documentation

## 2016-06-12 DIAGNOSIS — Z881 Allergy status to other antibiotic agents status: Secondary | ICD-10-CM | POA: Insufficient documentation

## 2016-06-12 DIAGNOSIS — I10 Essential (primary) hypertension: Secondary | ICD-10-CM | POA: Insufficient documentation

## 2016-06-12 DIAGNOSIS — Z79899 Other long term (current) drug therapy: Secondary | ICD-10-CM | POA: Insufficient documentation

## 2016-06-12 DIAGNOSIS — N6091 Unspecified benign mammary dysplasia of right breast: Secondary | ICD-10-CM | POA: Insufficient documentation

## 2016-06-12 DIAGNOSIS — Z888 Allergy status to other drugs, medicaments and biological substances status: Secondary | ICD-10-CM | POA: Insufficient documentation

## 2016-06-12 HISTORY — DX: Anxiety disorder, unspecified: F41.9

## 2016-06-12 HISTORY — DX: Unspecified osteoarthritis, unspecified site: M19.90

## 2016-06-12 HISTORY — PX: BREAST BIOPSY: SHX20

## 2016-06-12 HISTORY — PX: RADIOACTIVE SEED GUIDED EXCISIONAL BREAST BIOPSY: SHX6490

## 2016-06-12 LAB — GLUCOSE, CAPILLARY
GLUCOSE-CAPILLARY: 114 mg/dL — AB (ref 65–99)
Glucose-Capillary: 120 mg/dL — ABNORMAL HIGH (ref 65–99)

## 2016-06-12 SURGERY — RADIOACTIVE SEED GUIDED BREAST BIOPSY
Anesthesia: General | Site: Breast | Laterality: Right

## 2016-06-12 MED ORDER — BUPIVACAINE HCL (PF) 0.25 % IJ SOLN
INTRAMUSCULAR | Status: DC | PRN
  Administered 2016-06-12: 20 mL

## 2016-06-12 MED ORDER — HYDROMORPHONE HCL 1 MG/ML IJ SOLN
INTRAMUSCULAR | Status: AC
  Filled 2016-06-12: qty 1

## 2016-06-12 MED ORDER — LIDOCAINE HCL (CARDIAC) 20 MG/ML IV SOLN
INTRAVENOUS | Status: DC | PRN
Start: 2016-06-12 — End: 2016-06-12
  Administered 2016-06-12: 100 mg via INTRAVENOUS

## 2016-06-12 MED ORDER — FENTANYL CITRATE (PF) 100 MCG/2ML IJ SOLN
INTRAMUSCULAR | Status: AC
  Filled 2016-06-12: qty 2

## 2016-06-12 MED ORDER — EPHEDRINE SULFATE-NACL 50-0.9 MG/10ML-% IV SOSY
PREFILLED_SYRINGE | INTRAVENOUS | Status: DC | PRN
  Administered 2016-06-12 (×3): 10 mg via INTRAVENOUS

## 2016-06-12 MED ORDER — PROPOFOL 10 MG/ML IV BOLUS
INTRAVENOUS | Status: DC | PRN
  Administered 2016-06-12: 50 mg via INTRAVENOUS
  Administered 2016-06-12: 200 mg via INTRAVENOUS

## 2016-06-12 MED ORDER — HYDROCODONE-ACETAMINOPHEN 10-325 MG PO TABS: 1.0000 | ORAL_TABLET | Freq: Four times a day (QID) | ORAL | Status: DC | PRN

## 2016-06-12 MED ORDER — OXYCODONE HCL 5 MG/5ML PO SOLN: 5.0000 mg | Freq: Once | ORAL | Status: AC | PRN

## 2016-06-12 MED ORDER — OXYCODONE HCL 5 MG PO TABS
5.0000 mg | ORAL_TABLET | Freq: Once | ORAL | Status: AC | PRN
  Administered 2016-06-12: 5 mg via ORAL

## 2016-06-12 MED ORDER — CEFAZOLIN SODIUM-DEXTROSE 2-4 GM/100ML-% IV SOLN
INTRAVENOUS | Status: AC
  Filled 2016-06-12: qty 100

## 2016-06-12 MED ORDER — DEXTROSE 5 % IV SOLN
10.0000 mg | INTRAVENOUS | Status: DC | PRN
  Administered 2016-06-12: 50 ug via INTRAVENOUS

## 2016-06-12 MED ORDER — CEFAZOLIN SODIUM-DEXTROSE 2-4 GM/100ML-% IV SOLN
2.0000 g | INTRAVENOUS | Status: AC
  Administered 2016-06-12: 2 g via INTRAVENOUS

## 2016-06-12 MED ORDER — ONDANSETRON HCL 4 MG/2ML IJ SOLN
INTRAMUSCULAR | Status: AC
  Filled 2016-06-12: qty 2

## 2016-06-12 MED ORDER — MEPERIDINE HCL 25 MG/ML IJ SOLN: 6.2500 mg | INTRAMUSCULAR | Status: DC | PRN

## 2016-06-12 MED ORDER — PHENYLEPHRINE 40 MCG/ML (10ML) SYRINGE FOR IV PUSH (FOR BLOOD PRESSURE SUPPORT)
PREFILLED_SYRINGE | INTRAVENOUS | Status: DC | PRN
  Administered 2016-06-12 (×3): 80 ug via INTRAVENOUS

## 2016-06-12 MED ORDER — GLYCOPYRROLATE 0.2 MG/ML IJ SOLN: 0.2000 mg | Freq: Once | INTRAMUSCULAR | Status: DC | PRN

## 2016-06-12 MED ORDER — MIDAZOLAM HCL 2 MG/2ML IJ SOLN
INTRAMUSCULAR | Status: AC
  Filled 2016-06-12: qty 2

## 2016-06-12 MED ORDER — OXYCODONE HCL 5 MG PO TABS
ORAL_TABLET | ORAL | Status: AC
  Filled 2016-06-12: qty 1

## 2016-06-12 MED ORDER — DEXAMETHASONE SODIUM PHOSPHATE 4 MG/ML IJ SOLN
INTRAMUSCULAR | Status: DC | PRN
  Administered 2016-06-12: 5 mg via INTRAVENOUS

## 2016-06-12 MED ORDER — DEXAMETHASONE SODIUM PHOSPHATE 10 MG/ML IJ SOLN
INTRAMUSCULAR | Status: AC
  Filled 2016-06-12: qty 1

## 2016-06-12 MED ORDER — HYDROMORPHONE HCL 1 MG/ML IJ SOLN
0.2500 mg | INTRAMUSCULAR | Status: DC | PRN
  Administered 2016-06-12 (×2): 0.5 mg via INTRAVENOUS

## 2016-06-12 MED ORDER — LIDOCAINE HCL (PF) 1 % IJ SOLN
INTRAMUSCULAR | Status: AC
  Filled 2016-06-12: qty 30

## 2016-06-12 MED ORDER — MIDAZOLAM HCL 2 MG/2ML IJ SOLN
1.0000 mg | INTRAMUSCULAR | Status: DC | PRN
  Administered 2016-06-12: 2 mg via INTRAVENOUS

## 2016-06-12 MED ORDER — LACTATED RINGERS IV SOLN
INTRAVENOUS | Status: DC
  Administered 2016-06-12 (×2): via INTRAVENOUS

## 2016-06-12 MED ORDER — PHENYLEPHRINE 40 MCG/ML (10ML) SYRINGE FOR IV PUSH (FOR BLOOD PRESSURE SUPPORT)
PREFILLED_SYRINGE | INTRAVENOUS | Status: AC
  Filled 2016-06-12: qty 10

## 2016-06-12 MED ORDER — SCOPOLAMINE 1 MG/3DAYS TD PT72: 1.0000 | MEDICATED_PATCH | Freq: Once | TRANSDERMAL | Status: DC | PRN

## 2016-06-12 MED ORDER — LIDOCAINE 2% (20 MG/ML) 5 ML SYRINGE
INTRAMUSCULAR | Status: AC
  Filled 2016-06-12: qty 5

## 2016-06-12 MED ORDER — FENTANYL CITRATE (PF) 100 MCG/2ML IJ SOLN
50.0000 ug | INTRAMUSCULAR | Status: AC | PRN
  Administered 2016-06-12: 25 ug via INTRAVENOUS
  Administered 2016-06-12: 50 ug via INTRAVENOUS
  Administered 2016-06-12: 100 ug via INTRAVENOUS

## 2016-06-12 MED ORDER — ONDANSETRON HCL 4 MG/2ML IJ SOLN
INTRAMUSCULAR | Status: DC | PRN
  Administered 2016-06-12: 4 mg via INTRAVENOUS

## 2016-06-12 MED ORDER — PROPOFOL 10 MG/ML IV BOLUS
INTRAVENOUS | Status: AC
  Filled 2016-06-12: qty 40

## 2016-06-12 SURGICAL SUPPLY — 56 items
BINDER BREAST LRG (GAUZE/BANDAGES/DRESSINGS) IMPLANT
BINDER BREAST MEDIUM (GAUZE/BANDAGES/DRESSINGS) IMPLANT
BINDER BREAST XLRG (GAUZE/BANDAGES/DRESSINGS) ×3 IMPLANT
BINDER BREAST XXLRG (GAUZE/BANDAGES/DRESSINGS) IMPLANT
BLADE SURG 15 STRL LF DISP TIS (BLADE) ×1 IMPLANT
BLADE SURG 15 STRL SS (BLADE) ×2
CANISTER SUC SOCK COL 7IN (MISCELLANEOUS) IMPLANT
CANISTER SUCT 1200ML W/VALVE (MISCELLANEOUS) IMPLANT
CHLORAPREP W/TINT 26ML (MISCELLANEOUS) ×3 IMPLANT
CLIP TI WIDE RED SMALL 6 (CLIP) IMPLANT
CLOSURE WOUND 1/2 X4 (GAUZE/BANDAGES/DRESSINGS) ×1
COVER BACK TABLE 60X90IN (DRAPES) ×3 IMPLANT
COVER MAYO STAND STRL (DRAPES) ×3 IMPLANT
COVER PROBE W GEL 5X96 (DRAPES) ×3 IMPLANT
DECANTER SPIKE VIAL GLASS SM (MISCELLANEOUS) IMPLANT
DEVICE DUBIN W/COMP PLATE 8390 (MISCELLANEOUS) ×9 IMPLANT
DRAPE LAPAROSCOPIC ABDOMINAL (DRAPES) ×3 IMPLANT
DRAPE UTILITY XL STRL (DRAPES) ×3 IMPLANT
DRSG TEGADERM 4X4.75 (GAUZE/BANDAGES/DRESSINGS) ×3 IMPLANT
ELECT COATED BLADE 2.86 ST (ELECTRODE) ×3 IMPLANT
ELECT REM PT RETURN 9FT ADLT (ELECTROSURGICAL) ×3
ELECTRODE REM PT RTRN 9FT ADLT (ELECTROSURGICAL) ×1 IMPLANT
GLOVE BIO SURGEON STRL SZ7 (GLOVE) ×12 IMPLANT
GLOVE BIOGEL PI IND STRL 7.5 (GLOVE) ×1 IMPLANT
GLOVE BIOGEL PI INDICATOR 7.5 (GLOVE) ×2
GOWN STRL REUS W/ TWL LRG LVL3 (GOWN DISPOSABLE) ×3 IMPLANT
GOWN STRL REUS W/TWL LRG LVL3 (GOWN DISPOSABLE) ×6
ILLUMINATOR WAVEGUIDE N/F (MISCELLANEOUS) ×3 IMPLANT
KIT MARKER MARGIN INK (KITS) ×3 IMPLANT
LIGHT WAVEGUIDE WIDE FLAT (MISCELLANEOUS) IMPLANT
LIQUID BAND (GAUZE/BANDAGES/DRESSINGS) ×6 IMPLANT
NEEDLE HYPO 25X1 1.5 SAFETY (NEEDLE) ×3 IMPLANT
NS IRRIG 1000ML POUR BTL (IV SOLUTION) IMPLANT
PACK BASIN DAY SURGERY FS (CUSTOM PROCEDURE TRAY) ×3 IMPLANT
PENCIL BUTTON HOLSTER BLD 10FT (ELECTRODE) ×3 IMPLANT
SHEET MEDIUM DRAPE 40X70 STRL (DRAPES) ×9 IMPLANT
SLEEVE SCD COMPRESS KNEE MED (MISCELLANEOUS) ×3 IMPLANT
SPONGE GAUZE 4X4 12PLY STER LF (GAUZE/BANDAGES/DRESSINGS) ×3 IMPLANT
SPONGE LAP 4X18 X RAY DECT (DISPOSABLE) ×3 IMPLANT
STRIP CLOSURE SKIN 1/2X4 (GAUZE/BANDAGES/DRESSINGS) ×2 IMPLANT
SUT MNCRL AB 4-0 PS2 18 (SUTURE) ×3 IMPLANT
SUT MON AB 5-0 PS2 18 (SUTURE) ×3 IMPLANT
SUT SILK 2 0 SH (SUTURE) IMPLANT
SUT VIC AB 2-0 SH 27 (SUTURE) ×4
SUT VIC AB 2-0 SH 27XBRD (SUTURE) ×2 IMPLANT
SUT VIC AB 3-0 SH 27 (SUTURE) ×2
SUT VIC AB 3-0 SH 27X BRD (SUTURE) ×1 IMPLANT
SUT VIC AB 5-0 PS2 18 (SUTURE) IMPLANT
SUT VICRYL AB 3 0 TIES (SUTURE) IMPLANT
SYR CONTROL 10ML LL (SYRINGE) ×3 IMPLANT
TAPE STRIPS DRAPE STRL (GAUZE/BANDAGES/DRESSINGS) ×6 IMPLANT
TOWEL OR 17X24 6PK STRL BLUE (TOWEL DISPOSABLE) ×3 IMPLANT
TOWEL OR NON WOVEN STRL DISP B (DISPOSABLE) IMPLANT
TUBE CONNECTING 20'X1/4 (TUBING)
TUBE CONNECTING 20X1/4 (TUBING) IMPLANT
YANKAUER SUCT BULB TIP NO VENT (SUCTIONS) IMPLANT

## 2016-06-12 NOTE — Anesthesia Preprocedure Evaluation (Signed)
Anesthesia Evaluation  Patient identified by MRN, date of birth, ID band Patient awake    Reviewed: Allergy & Precautions, NPO status , Patient's Chart, lab work & pertinent test results  Airway Mallampati: II  TM Distance: >3 FB Neck ROM: Full    Dental  (+) Teeth Intact, Dental Advisory Given   Pulmonary    breath sounds clear to auscultation       Cardiovascular hypertension, Pt. on medications  Rhythm:Regular Rate:Normal     Neuro/Psych    GI/Hepatic GERD  Medicated and Controlled,  Endo/Other  diabetes, Well Controlled, Type 2, Oral Hypoglycemic AgentsMorbid obesity  Renal/GU      Musculoskeletal   Abdominal   Peds  Hematology   Anesthesia Other Findings   Reproductive/Obstetrics                             Anesthesia Physical Anesthesia Plan  ASA: III  Anesthesia Plan: General   Post-op Pain Management:    Induction: Intravenous  Airway Management Planned: LMA  Additional Equipment:   Intra-op Plan:   Post-operative Plan: Extubation in OR  Informed Consent: I have reviewed the patients History and Physical, chart, labs and discussed the procedure including the risks, benefits and alternatives for the proposed anesthesia with the patient or authorized representative who has indicated his/her understanding and acceptance.   Dental advisory given  Plan Discussed with: CRNA, Anesthesiologist and Surgeon  Anesthesia Plan Comments:         Anesthesia Quick Evaluation

## 2016-06-12 NOTE — Anesthesia Procedure Notes (Signed)
Procedure Name: LMA Insertion Date/Time: 06/12/2016 10:36 AM Performed by: Lieutenant Diego Pre-anesthesia Checklist: Patient identified, Emergency Drugs available, Suction available and Patient being monitored Patient Re-evaluated:Patient Re-evaluated prior to inductionOxygen Delivery Method: Circle system utilized Preoxygenation: Pre-oxygenation with 100% oxygen Intubation Type: IV induction Ventilation: Mask ventilation without difficulty LMA: LMA inserted LMA Size: 4.0 Number of attempts: 1 Airway Equipment and Method: Bite block Placement Confirmation: positive ETCO2 and breath sounds checked- equal and bilateral Tube secured with: Tape Dental Injury: Teeth and Oropharynx as per pre-operative assessment

## 2016-06-12 NOTE — Op Note (Signed)
Preoperative diagnoses:right breast mass times three Postoperative diagnosis: Same as above Procedure:Right breast seed guided excisional biopsy, right breast seed guided excisional biopsy and right breast wire guided biopsy via same separate incision Surgeon: Dr. Serita Grammes Anesthesia: Gen. Estimated blood loss: Minimal Complications: None Drains: None Specimens:right breast tissue marked with paint with seed 1 (RA), right breast with seed 2 posterior lesion, right breast with wire  Sponge and needle count correct at completion Disposition to recovery stable  Indications: This is a 27 yof with three abnormalities, one with alh.  All have been recommended for excision.  We discussed two seeds and wire placement for directed excisions. These have all been done and she presents for or.  I had her mm in the OR>   Procedure:After informed consent was obtained she was then taken to the operating room. She was given cefazolin. Sequential compression devices on her legs. She was placed under general anesthesia without complication. Her right breast was then prepped and draped in the standard sterile surgical fashion. A surgical timeout was then performed.  I located the retroareolar radioactive seed with the neoprobe.I infiltrated marcaine in the periareolar area and then made a periareolar incision. I then used the neoprobe to guide the excision of the seed and surrounding tissue. . This was confirmed by the neoprobe. This was painted. This was then taken for mammogram which confirmed removal of the clip and the seed. This was confirmed by radiology. This was then sent to pathology. Hemostasis was observed. I closed the breast tissue with a 2-0 Vicryl. The dermis was closed with 3-0 Vicryl the skin with 5-0 Monocryl. Dermabond was placed.  I then located the other seed with was lower outer quadrant.  This was near wire as well. I infiltrated marcaine in the IM fold and made an incision. I used the  neoprobe to guide excision of this seed as well.  This was confirmed by neoprobe. Mammography confirmed removal of the seed and clip.  I then located the wire and removed this tissue. The wire and clip were confirmed removed by mammography as well.  I then obtained hemostasis. This incision was closed with 2-0 vicryl, 3-0 vicryl and 4-0 monocryl. Glue and steristrips were applied.  She was extubated. She was transferred to recovery stable.

## 2016-06-12 NOTE — Discharge Instructions (Signed)
[facility] °Office Phone Number [phone] ° °POST OP INSTRUCTIONS ° °Always review your discharge instruction sheet given to you by the facility where your surgery was performed. ° °IF YOU HAVE DISABILITY OR FAMILY LEAVE FORMS, YOU MUST BRING THEM TO THE OFFICE FOR PROCESSING.  DO NOT GIVE THEM TO YOUR DOCTOR. ° °1. A prescription for pain medication may be given to you upon discharge.  Take your pain medication as prescribed, if needed.  If narcotic pain medicine is not needed, then you may take acetaminophen (Tylenol), naprosyn (Alleve) or ibuprofen (Advil) as needed. °2. Take your usually prescribed medications unless otherwise directed °3. If you need a refill on your pain medication, please contact your pharmacy.  They will contact our office to request authorization.  Prescriptions will not be filled after 5pm or on week-ends. °4. You should eat very light the first 24 hours after surgery, such as soup, crackers, pudding, etc.  Resume your normal diet the day after surgery. °5. Most patients will experience some swelling and bruising in the breast.  Ice packs and a good support bra will help.  Wear the breast binder provided or a sports bra for 72 hours day and night.  After that wear a sports bra during the day until you return to the office. Swelling and bruising can take several days to resolve.  °6. It is common to experience some constipation if taking pain medication after surgery.  Increasing fluid intake and taking a stool softener will usually help or prevent this problem from occurring.  A mild laxative (Milk of Magnesia or Miralax) should be taken according to package directions if there are no bowel movements after 48 hours. °7. Unless discharge instructions indicate otherwise, you may remove your bandages 48 hours after surgery and you may shower at that time.  You may have steri-strips (small skin tapes) in place directly over the incision.  These strips should be left on the  skin for 7-10 days and will come off on their own.  If your surgeon used skin glue on the incision, you may shower in 24 hours.  The glue will flake off over the next 2-3 weeks.  Any sutures or staples will be removed at the office during your follow-up visit. °8. ACTIVITIES:  You may resume regular daily activities (gradually increasing) beginning the next day.  Wearing a good support bra or sports bra minimizes pain and swelling.  You may have sexual intercourse when it is comfortable. °a. You may drive when you no longer are taking prescription pain medication, you can comfortably wear a seatbelt, and you can safely maneuver your car and apply brakes. °b. RETURN TO WORK:  ______________________________________________________________________________________ °9. You should see your doctor in the office for a follow-up appointment approximately two weeks after your surgery.  Your doctor’s nurse will typically make your follow-up appointment when she calls you with your pathology report.  Expect your pathology report 3-4 business days after your surgery.  You may call to check if you do not hear from us after three days. °10. OTHER INSTRUCTIONS: _______________________________________________________________________________________________ _____________________________________________________________________________________________________________________________________ °_____________________________________________________________________________________________________________________________________ °_____________________________________________________________________________________________________________________________________ ° °WHEN TO CALL DR WAKEFIELD: °1. Fever over 101.0 °2. Nausea and/or vomiting. °3. Extreme swelling or bruising. °4. Continued bleeding from incision. °5. Increased pain, redness, or drainage from the incision. ° °The clinic staff is available to answer your questions during regular  business hours.  Please don’t hesitate to call and ask to speak to one of the nurses for clinical concerns.  If   you have a medical emergency, go to the nearest emergency room or call 911.  A surgeon from [facility] is always on call at the hospital. ° °For further questions, please visit [email or web address] ° ° ° °Post Anesthesia Home Care Instructions ° °Activity: °Get plenty of rest for the remainder of the day. A responsible adult should stay with you for 24 hours following the procedure.  °For the next 24 hours, DO NOT: °-Drive a car °-Operate machinery °-Drink alcoholic beverages °-Take any medication unless instructed by your physician °-Make any legal decisions or sign important papers. ° °Meals: °Start with liquid foods such as gelatin or soup. Progress to regular foods as tolerated. Avoid greasy, spicy, heavy foods. If nausea and/or vomiting occur, drink only clear liquids until the nausea and/or vomiting subsides. Call your physician if vomiting continues. ° °Special Instructions/Symptoms: °Your throat may feel dry or sore from the anesthesia or the breathing tube placed in your throat during surgery. If this causes discomfort, gargle with warm salt water. The discomfort should disappear within 24 hours. ° °If you had a scopolamine patch placed behind your ear for the management of post- operative nausea and/or vomiting: ° °1. The medication in the patch is effective for 72 hours, after which it should be removed.  Wrap patch in a tissue and discard in the trash. Wash hands thoroughly with soap and water. °2. You may remove the patch earlier than 72 hours if you experience unpleasant side effects which may include dry mouth, dizziness or visual disturbances. °3. Avoid touching the patch. Wash your hands with soap and water after contact with the patch. °  ° °

## 2016-06-12 NOTE — Interval H&P Note (Signed)
History and Physical Interval Note:  06/12/2016 10:14 AM  [name]  has presented today for surgery, with the diagnosis of right breast mass x 3  The various methods of treatment have been discussed with the patient and family. After consideration of risks, benefits and other options for treatment, the patient has consented to  Procedure(s): RIGHT RADIOACTIVE SEED GUIDED EXCISIONAL BREAST BIOPSY X 2 (Right) RIGHT BREAST BIOPSY WITH NEEDLE LOCALIZATION (Right) as a surgical intervention .  The patient's history has been reviewed, patient examined, no change in status, stable for surgery.  I have reviewed the patient's chart and labs.  Questions were answered to the patient's satisfaction.     Rachel Nixon

## 2016-06-12 NOTE — Transfer of Care (Signed)
Immediate Anesthesia Transfer of Care Note  Patient: Rachel Nixon  Procedure(s) Performed: Procedure(s) with comments: RIGHT RADIOACTIVE SEED GUIDED EXCISIONAL BREAST BIOPSY X 2 (Right) - RIGHT RADIOACTIVE SEED GUIDED EXCISIONAL BREAST BIOPSY X 2 RIGHT BREAST BIOPSY WITH NEEDLE LOCALIZATION (Right) - RIGHT BREAST BIOPSY WITH NEEDLE LOCALIZATION  Patient Location: PACU  Anesthesia Type:General  Level of Consciousness: awake  Airway & Oxygen Therapy: Patient Spontanous Breathing and Patient connected to face mask oxygen  Post-op Assessment: Report given to RN and Post -op Vital signs reviewed and stable  Post vital signs: Reviewed and stable  Last Vitals:  Filed Vitals:   06/12/16 0936  BP: 127/88  Pulse: 94  Temp: 36.8 C  Resp: 18    Last Pain: There were no vitals filed for this visit.    Patients Stated Pain Goal: 0 ([id number])  Complications: No apparent anesthesia complications

## 2016-06-12 NOTE — H&P (Addendum)
60 yof with history of benign surgical biopsy and a family history in paternal GM presents after abnl mm. she has density c breasts. she was getting f/u after tomo guided biopsy of distortion in the outer right breast. she had unchanged distortion at this site with X clip nearly 3 cm above that. there is additional area of subtle distortion in the outer posterior right breast which is felt to be postsurgical and is stable. there is a 4 mm group of coarse calcs unchanged. there is a prominent single dilated duct in retroareolar area. US shows dilated duct in subareolar breast at 8 oclock 1 cm from nipple with calcs and mass measuring 9 mm. no lad in axilla. she was recommended for mri. she underwent mri that shows within the lower outer right breast a 10x10x5 mm area of distortion with enhancement. this area was attempted to undergo biopsy on 8/16. the biopsy marker was not at site though. there is no enhancement of other area of distortion. there is abnl intraductal enhancement measuring 1.5x1.2x0.9 cm. the left breast is normal and all nodes are normal. she underwent tomo guided biopsy of the right loq distortion with placement of a coil clip. this is a csl with alh. she has also undergone attempt at biopsy of the two areas on mri. there is the intraductal mas in ra position as well as the lower outer quadrant. she was unable to do biopsy. she however apparently did have clips placed at both of these sites. she is here today to discuss   Other Problems  Anxiety Disorder Arthritis Cholelithiasis Depression Diabetes Mellitus Gastroesophageal Reflux Disease High blood pressure Hypercholesterolemia Migraine Headache  Past Surgical History  Breast Biopsy Bilateral. Foot Surgery Left. Gallbladder Surgery - Open Hysterectomy (not due to cancer) - Complete Oral Surgery Tonsillectomy  Diagnostic Studies History Colonoscopy 1-5 years ago Mammogram within last  year Pap Smear 1-5 years ago  Allergies  Etodolac *ANALGESICS - ANTI-INFLAMMATORY* Erythromycin Estolate *CHEMICALS*  Medication History Amitriptyline HCl (10MG  Tablet, Oral) Active. BuPROPion HCl ER (XL) (150MG  Tablet ER 24HR, Oral) Active. Atorvastatin Calcium (20MG  Tablet, Oral) Active. Lisinopril (10MG  Tablet, Oral) Active. MetFORMIN HCl ER (500MG  Tablet ER 24HR, Oral) Active. Omeprazole (10MG  Capsule DR, Oral) Active. Vitamin D (Ergocalciferol) (50000UNIT Capsule, Oral) Active. Medications Reconciled  Social History  Alcohol use Remotely quit alcohol use. Caffeine use Carbonated beverages, Coffee. Illicit drug use Remotely quit drug use. Tobacco use Never smoker.  Family History  Alcohol Abuse Brother, Son. Arthritis Father, Mother. Breast Cancer Family Members In General. Cerebrovascular Accident Father. Colon Polyps Family Members In General. Depression Mother. Diabetes Mellitus Brother, Father. Heart Disease Father. Heart disease in female family member before age 53 Hypertension Brother, Father. Ischemic Bowel Disease Mother. Melanoma Father. Migraine Headache Mother, Son. Respiratory Condition Father.  Pregnancy / Birth History Age at menarche 79 years. Age of menopause 51-55 Contraceptive History Oral contraceptives. Gravida 1 Maternal age 44-30 Para 1  Review of Systems  General Not Present- Appetite Loss, Chills, Fatigue, Fever, Night Sweats, Weight Gain and Weight Loss. Skin Not Present- Change in Wart/Mole, Dryness, Hives, Jaundice, New Lesions, Non-Healing Wounds, Rash and Ulcer. HEENT Present- Ringing in the Ears, Seasonal Allergies and Wears glasses/contact lenses. Not Present- Earache, Hearing Loss, Hoarseness, Nose Bleed, Oral Ulcers, Sinus Pain, Sore Throat, Visual Disturbances and Yellow Eyes. Respiratory Not Present- Bloody sputum, Chronic Cough, Difficulty Breathing, Snoring and Wheezing. Breast Not Present-  Breast Mass, Breast Pain, Nipple Discharge and Skin Changes. Cardiovascular Present- Leg Cramps and Swelling  of Extremities. Not Present- Chest Pain, Difficulty Breathing Lying Down, Palpitations, Rapid Heart Rate and Shortness of Breath. Gastrointestinal Present- Chronic diarrhea, Constipation and Indigestion. Not Present- Abdominal Pain, Bloating, Bloody Stool, Change in Bowel Habits, Difficulty Swallowing, Excessive gas, Gets full quickly at meals, Hemorrhoids, Nausea, Rectal Pain and Vomiting. Female Genitourinary Present- Nocturia and Urgency. Not Present- Frequency, Painful Urination and Pelvic Pain. Musculoskeletal Present- Joint Pain and Swelling of Extremities. Not Present- Back Pain, Joint Stiffness, Muscle Pain and Muscle Weakness. Neurological Present- Headaches and Numbness. Not Present- Decreased Memory, Fainting, Seizures, Tingling, Tremor, Trouble walking and Weakness. Psychiatric Present- Depression. Not Present- Anxiety, Bipolar, Change in Sleep Pattern, Fearful and Frequent crying. Endocrine Not Present- Cold Intolerance, Excessive Hunger, Hair Changes, Heat Intolerance, Hot flashes and New Diabetes. Hematology Not Present- Easy Bruising, Excessive bleeding, Gland problems, HIV and Persistent Infections.  Vitals  Weight: 223 lb Height: 63in Body Surface Area: 2.03 m Body Mass Index: 39.5 kg/m  Temp.: 7F(Temporal)  Pulse: 75 (Regular)  BP: 126/80 (Sitting, Left Arm, Standard) Physical Exam  General Mental Status-Alert. Orientation-Oriented X3. Chest and Lung Exam Chest and lung exam reveals -on auscultation, normal breath sounds, no adventitious sounds and normal vocal resonance. Breast Nipples-No Discharge. Breast Lump-No Palpable Breast Mass. Cardiovascular Cardiovascular examination reveals -normal heart sounds, regular rate and rhythm with no murmurs. Lymphatic Head & Neck General Head & Neck Lymphatics: Bilateral - Description -  Normal. Axillary General Axillary Region: Bilateral - Description - Normal. 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 Will plan on placing two seeds and a wire.

## 2016-06-12 NOTE — Anesthesia Postprocedure Evaluation (Signed)
Anesthesia Post Note  Patient: Rachel Nixon  Procedure(s) Performed: Procedure(s) (LRB): RIGHT RADIOACTIVE SEED GUIDED EXCISIONAL BREAST BIOPSY X 2 (Right) RIGHT BREAST BIOPSY WITH NEEDLE LOCALIZATION (Right)  Patient location during evaluation: PACU Anesthesia Type: General and Regional Level of consciousness: awake and alert Pain management: pain level controlled Vital Signs Assessment: post-procedure vital signs reviewed and stable Respiratory status: spontaneous breathing, nonlabored ventilation, respiratory function stable and patient connected to nasal cannula oxygen Cardiovascular status: blood pressure returned to baseline and stable Postop Assessment: no signs of nausea or vomiting Anesthetic complications: no    Last Vitals:  Filed Vitals:   06/12/16 1217 06/12/16 1248  BP:  108/82  Pulse: 96 99  Temp:  36.5 C  Resp: 13 18    Last Pain:  Filed Vitals:   06/12/16 1312  PainSc: 3                  Montez Hageman

## 2016-06-18 ENCOUNTER — Encounter (HOSPITAL_BASED_OUTPATIENT_CLINIC_OR_DEPARTMENT_OTHER): Payer: Self-pay | Admitting: General Surgery

## 2016-06-25 ENCOUNTER — Telehealth: Payer: Self-pay | Admitting: Cardiovascular Disease

## 2016-06-25 NOTE — Telephone Encounter (Signed)
Received records from [place] for appointment on 07/29/16 with Dr Oval Linsey.  Records given to [employer] (medical records) for D rRandolph's schedule on 07/29/16. lp

## 2016-07-15 ENCOUNTER — Telehealth: Payer: Self-pay | Admitting: Oncology

## 2016-07-15 NOTE — Telephone Encounter (Signed)
Appointment with Mag[facility] on 8/16. Patient agreed.. Demographics verified. Letter to the referring and mailed to the patient.

## 2016-07-18 ENCOUNTER — Encounter: Payer: Self-pay | Admitting: Oncology

## 2016-07-23 ENCOUNTER — Other Ambulatory Visit: Payer: Self-pay | Admitting: *Deleted

## 2016-07-23 DIAGNOSIS — N6310 Unspecified lump in the right breast, unspecified quadrant: Secondary | ICD-10-CM

## 2016-07-24 ENCOUNTER — Ambulatory Visit (HOSPITAL_BASED_OUTPATIENT_CLINIC_OR_DEPARTMENT_OTHER): Payer: Managed Care, Other (non HMO) | Admitting: Oncology

## 2016-07-24 ENCOUNTER — Other Ambulatory Visit (HOSPITAL_BASED_OUTPATIENT_CLINIC_OR_DEPARTMENT_OTHER): Payer: Managed Care, Other (non HMO)

## 2016-07-24 DIAGNOSIS — E119 Type 2 diabetes mellitus without complications: Secondary | ICD-10-CM

## 2016-07-24 DIAGNOSIS — N6310 Unspecified lump in the right breast, unspecified quadrant: Secondary | ICD-10-CM

## 2016-07-24 DIAGNOSIS — I10 Essential (primary) hypertension: Secondary | ICD-10-CM | POA: Diagnosis not present

## 2016-07-24 DIAGNOSIS — N6091 Unspecified benign mammary dysplasia of right breast: Secondary | ICD-10-CM

## 2016-07-24 LAB — CBC WITH DIFFERENTIAL/PLATELET
BASO%: 0.2 % (ref 0.0–2.0)
Basophils Absolute: 0 10*3/uL (ref 0.0–0.1)
EOS%: 1.7 % (ref 0.0–7.0)
Eosinophils Absolute: 0.2 10*3/uL (ref 0.0–0.5)
HCT: 40.7 % (ref 34.8–46.6)
HGB: 13.1 g/dL (ref 11.6–15.9)
LYMPH%: 34.4 % (ref 14.0–49.7)
MCH: 25.2 pg (ref 25.1–34.0)
MCHC: 32.2 g/dL (ref 31.5–36.0)
MCV: 78.3 fL — ABNORMAL LOW (ref 79.5–101.0)
MONO#: 0.8 10*3/uL (ref 0.1–0.9)
MONO%: 8.5 % (ref 0.0–14.0)
NEUT%: 55.2 % (ref 38.4–76.8)
NEUTROS ABS: 5 10*3/uL (ref 1.5–6.5)
PLATELETS: 280 10*3/uL (ref 145–400)
RBC: 5.2 10*6/uL (ref 3.70–5.45)
RDW: 14.5 % (ref 11.2–14.5)
WBC: 9 10*3/uL (ref 3.9–10.3)
lymph#: 3.1 10*3/uL (ref 0.9–3.3)

## 2016-07-24 LAB — COMPREHENSIVE METABOLIC PANEL
ALT: 35 U/L (ref 0–55)
ANION GAP: 11 meq/L (ref 3–11)
AST: 31 U/L (ref 5–34)
Albumin: 3.7 g/dL (ref 3.5–5.0)
Alkaline Phosphatase: 131 U/L (ref 40–150)
BILIRUBIN TOTAL: 0.36 mg/dL (ref 0.20–1.20)
BUN: 14.5 mg/dL (ref 7.0–26.0)
CHLORIDE: 103 meq/L (ref 98–109)
CO2: 25 meq/L (ref 22–29)
CREATININE: 0.9 mg/dL (ref 0.6–1.1)
Calcium: 9.9 mg/dL (ref 8.4–10.4)
EGFR: 74 mL/min/{1.73_m2} — ABNORMAL LOW (ref >90)
GLUCOSE: 105 mg/dL (ref 70–140)
Potassium: 4.3 mEq/L (ref 3.5–5.1)
SODIUM: 138 meq/L (ref 136–145)
TOTAL PROTEIN: 7.4 g/dL (ref 6.4–8.3)

## 2016-07-24 MED ORDER — RALOXIFENE HCL 60 MG PO TABS: 60.0000 mg | ORAL_TABLET | Freq: Every day | ORAL | 4 refills | Status: DC

## 2016-07-24 NOTE — Progress Notes (Signed)
[facility]  [phone] [phone]     ID: AISHWARYA PEPLINSKI DOB: [birth year]  MR#: [id number]  [id number]  Patient Care Team: Darcus Austin, MD as PCP - General (Family Medicine) Rolm Bookbinder, MD as Consulting Physician (General Surgery) Chauncey Cruel, MD as Consulting Physician (Oncology) Clarene Essex, MD as Consulting Physician (Gastroenterology) Skeet Latch, MD as Attending Physician (Cardiology) OTHER MD:  CHIEF COMPLAINT: Atypical ductal hyperplasia  CURRENT TREATMENT: To start raloxifene   BREAST CANCER HISTORY: Bridgit had routine bilateral screening mamm[facility] 07/04/2015 showing an area of possible distortion in the right breast. On 07/10/2015 she underwent right diagnostic mammography with tomosynthesis and ultrasonography. The breast density was category C. This confirmed an area of architectural distortion in the lower outer quadrant of the right breast associated with coarse and punctate calcifications more medially. Ultrasonography was negative. Biopsy of the area of distortion in the right breast 07/18/2015 showed ([id number]) no atypia or malignancy.  Six-month follow-up mammography was recommended, and performed 02/22/2016. The area of distortion in the lower outer right breast was unchanged. There was an additional area of distortion more posterior which was also felt to be mammographically stable. The 4 mm group of likely dystrophic calcifications also appear unchanged. Physical exam was benign. Whenever targeted ultrasound of the right breast showed a dilated duct in the subareolar right breast at the 6:00 position, measuring 0.9 cm. There was no evidence of lymphadenopathy.  Because of the questionable findings, bilateral MRI was recommended and performed 04/12/2016. This showed in the lower outer right breast at 1.0 cm area of distortion which had been biopsied in August 2016. There was abnormal  intraductal enhancement measuring 1.5 cm in the right subareolar region. The left breast was unremarkable and there was no adenopathy of concern.  On 05/01/2016, she underwent MRI guided biopsy of the lower outer quadrant area in question and this showed a complex sclerosing lesion ([id number] [id number]). The patient was then referred to surgery and after appropriate discussion underwent biopsy of the 3 areas in question in the right breast 06/12/2016. 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 The margins were clear. Patient is being referred for discussion of breast cancer high-risk management   Her subsequent history is as detailed below  INTERVAL HISTORY: Her case was also presented in the multidisciplinary breast cancer conference that same morning. At that time a preliminary plan was proposed:  REVIEW OF SYSTEMS:  PAST MEDICAL HISTORY: Past Medical History:  Diagnosis Date  . Allergy   . Anxiety   . Arthritis    hips, knees, hands  . Depression   . Diabetes mellitus without complication ([place])   . GERD (gastroesophageal reflux disease)   . Hyperlipidemia   . Hypertension   . Migraine headache   . Obesity     PAST SURGICAL HISTORY: Past Surgical History:  Procedure Laterality Date  . ABDOMINAL HYSTERECTOMY    . BREAST BIOPSY Right 06/12/2016   Procedure: RIGHT BREAST BIOPSY WITH NEEDLE LOCALIZATION;  Surgeon: Rolm Bookbinder, MD;  Location: [facility];  Service: General;  Laterality: Right;  RIGHT BREAST BIOPSY WITH NEEDLE LOCALIZATION  . CHOLECYSTECTOMY    . FRACTURE SURGERY    . RADIOACTIVE SEED GUIDED EXCISIONAL BREAST BIOPSY Right 06/12/2016   Procedure: RIGHT RADIOACTIVE SEED GUIDED EXCISIONAL BREAST BIOPSY X 2;  Surgeon: Rolm Bookbinder, MD;  Location: [facility];  Service:  General;  Laterality: Right;  RIGHT RADIOACTIVE SEED GUIDED EXCISIONAL BREAST  BIOPSY X 2  . TONSILLECTOMY      FAMILY HISTORY Family History  Problem Relation Age of Onset  . CAD Father   . Diabetes type II Brother   . Cancer Other   . Hypertension Other   . COPD Other   . Hyperlipidemia Other   . Diabetes Other   . Stroke Other   . Heart disease Other   . Dementia Other   The patient's father is living at age 2 and her mother is living at age 66 as of August 2016. The patient has one brother, no sisters. The paternal grandmother had breast cancer at a late age. A maternal grandmother had non-Hodgkin's lymphoma.  GYNECOLOGIC HISTORY:  No LMP recorded. Patient has had a hysterectomy. Menarche age 60, first live birth age 60, the patient is GX P1. She underwent simple hysterectomy without salpingo-oophorectomy in 2001 and took hormone replacement approximately 7 years. She had no complications  SOCIAL HISTORY:  Gennett works as a Herbalist. She is divorced and lives by herself, with no pets. Son Harrell Gave lives in [place]. He is a former 8. The patient has 2 grandchildren, one of whom has a rare genetics disorder. The patient attends the [employer] DIRECTIVES: Her son is her healthcare power of attorney. He can be reached at [phone]   HEALTH MAINTENANCE: Social History  Substance Use Topics  . Smoking status: Never Smoker  . Smokeless tobacco: Not on file  . Alcohol use No     Colonoscopy:  PAP:  Bone density:   Allergies  Allergen Reactions  . Erythromycin Nausea Only  . Lodine [Etodolac] Hives    Current Outpatient Prescriptions  Medication Sig Dispense Refill  . amitriptyline (ELAVIL) 25 MG tablet Take 40 mg by mouth at bedtime.     Marland Kitchen aspirin 81 MG chewable tablet Chew 81 mg by mouth daily.    Marland Kitchen atorvastatin (LIPITOR) 20 MG tablet Take 20 mg by mouth daily.    Marland Kitchen buPROPion (WELLBUTRIN XL) 150 MG 24 hr tablet Take 150 mg by mouth daily.    . Cholecalciferol (VITAMIN D3) 3000 units TABS  Take by mouth.    . ergocalciferol (VITAMIN D2) 50000 UNITS capsule Take 50,000 Units by mouth 2 (two) times a week. Sunday and thursday    . glucosamine-chondroitin 500-400 MG tablet Take 1 tablet by mouth 3 (three) times daily.    . Lactobacillus (PROBIOTIC ACIDOPHILUS PO) Take by mouth.    Marland Kitchen lisinopril (PRINIVIL,ZESTRIL) 10 MG tablet Take 10 mg by mouth daily.    . metFORMIN (GLUCOPHAGE) 500 MG tablet Take 1,000 mg by mouth daily with breakfast.     . Multiple Vitamin (MULTIVITAMIN WITH MINERALS) TABS tablet Take 1 tablet by mouth daily.    Marland Kitchen omeprazole (PRILOSEC) 10 MG capsule Take 10 mg by mouth daily.    . Turmeric 500 MG CAPS Take by mouth.     No current facility-administered medications for this visit.     OBJECTIVE: Middle-aged white woman in no acute distress  Vitals:   07/24/16 1634  BP: 116/83  Pulse: (!) 105  Resp: 18  Temp: 98.8 F (37.1 C)     Body mass index is 38.9 kg/m.    ECOG FS:0 - Asymptomatic  Ocular: Sclerae unicteric, pupils equal, round and reactive to light Ear-nose-throat: Oropharynx clear and moist Lymphatic: No cervical or supraclavicular adenopathy Lungs no  rales or rhonchi, good excursion bilaterally Heart regular rate and rhythm, no murmur appreciated Abd soft, nontender, positive bowel sounds MSK no focal spinal tenderness, no joint edema Neuro: non-focal, well-oriented, appropriate affect Breasts: Right breast is status post recent biopsies. No palpable masses. There are no skin or nipple changes of concern. The right axilla is benign. Left breast is unremarkable.   LAB RESULTS:  CMP     Component Value Date/Time   NA 138 07/24/2016 1615   K 4.3 07/24/2016 1615   CL 106 06/06/2016 1215   CO2 25 07/24/2016 1615   GLUCOSE 105 07/24/2016 1615   BUN 14.5 07/24/2016 1615   CREATININE 0.9 07/24/2016 1615   CALCIUM 9.9 07/24/2016 1615   PROT 7.4 07/24/2016 1615   ALBUMIN 3.7 07/24/2016 1615   AST 31 07/24/2016 1615   ALT 35 07/24/2016  1615   ALKPHOS 131 07/24/2016 1615   BILITOT 0.36 07/24/2016 1615   GFRNONAA >60 06/06/2016 1215   GFRAA >60 06/06/2016 1215    INo results found for: SPEP, UPEP  Lab Results  Component Value Date   WBC 9.0 07/24/2016   NEUTROABS 5.0 07/24/2016   HGB 13.1 07/24/2016   HCT 40.7 07/24/2016   MCV 78.3 (L) 07/24/2016   PLT 280 07/24/2016      Chemistry      Component Value Date/Time   NA 138 07/24/2016 1615   K 4.3 07/24/2016 1615   CL 106 06/06/2016 1215   CO2 25 07/24/2016 1615   BUN 14.5 07/24/2016 1615   CREATININE 0.9 07/24/2016 1615      Component Value Date/Time   CALCIUM 9.9 07/24/2016 1615   ALKPHOS 131 07/24/2016 1615   AST 31 07/24/2016 1615   ALT 35 07/24/2016 1615   BILITOT 0.36 07/24/2016 1615       No results found for: LABCA2  No components found for: LABCA125  No results for input(s): INR in the last 168 hours.  Urinalysis No results found for: COLORURINE, APPEARANCEUR, LABSPEC, PHURINE, GLUCOSEU, HGBUR, BILIRUBINUR, KETONESUR, PROTEINUR, UROBILINOGEN, NITRITE, LEUKOCYTESUR   STUDIES: Outside studies reviewed   ELIGIBLE FOR AVAILABLE RESEARCH PROTOCOL: no  ASSESSMENT: 60 y.o. [place] woman Status post right breast biopsy 06/12/2016 for atypical ductal hyperplasia in the setting of a radial scar  (1) breast density category C  (2) to start raloxifene for risk reduction 07/24/2016  PLAN: We spent the better part of today's 50-minute appointment discussing the biology of breast cancer in general, and the specifics of atypical ductal hyperplasia [ADH] more specifically. Jasmeet understands ADH means, first, relatively unrestricted growth of breast cells and, in addition, some morphologically different features from simple hyperplasia. Atypical ductal hyperplasia can be difficult to tell apart ductal cancer in situ. For those reasons ADH lesions need to be removed.  ADH is also a marker of breast cancer risk. The risk of developing breast  cancer in patients with ADH falls somewhere between one half and 1% per year. The new cancer can develop in either breast. One half of those tumors would be invasive.   Working from the patient's age to the average survival of women today in the [place] I think Rikiyah can expect to live an additional 20-25 years. Using the high end of the risk spectrum for purposes of discussion, this would give her a 20-25% chance of developing  another breast cancer in herremaining  lifetime. This ismore than twice  the normal risk.  Kehinde has essentially two ways of lowering that risk. One of  them is bilateral mastectomies. This is not recommended for this indication and I do not believe it would be covered by insurance even if she wanted it which she doesn't. The other, more reasonable approach would be to take anti-estrogens. This could be tamoxifen, raloxifene/Evista, or an aromatase inhibitor  We then discussed the possible toxicities, side effects and complications of the tamoxifen group as opposed to the aromatase inhibitors. All information was given to [place] in writing. Any of those agents if taken for 5 years would essentially cut the risk of developing a new breast cancer in half.  We also discussed intensified screening. This would be adding yearly MRI to yearly mammography with tomography. This approach greatly increases sensitivity. Any breast cancer that may develop should be found at the earliest possible stage. However intensified screening has not been documented to improve survival in this population. There are issues regarding cost even if insurance coverage is obtained. There also concerns regarding false positives especially in the first or second year of MRI screening.  Bula has a good understanding of all these options. At a minimum she understands she needs to have yearly mammography with tomography and a yearly or preferably biannual physician breast exam.   She has had a "bad experiences" with  MRI and is deterred by the risk of false positives as well as cost issues.  Accordingly what she would like to do after much discussion is take raloxifene. This is especially favorable since she does not have to worry about uterine concerns, being post hysterectomy, and also because she took estrogen for more than 7 years with no clotting complications. I' placed that prescription for her. She will see me again in approximately 3 months. If she is tolerating raloxifene without any unusual side effects, I will release her back to her primary care physician who already sees her twice a year and would be in the best decision to do biannual breast exams  Nakya has a good understanding of the overall plan. She agrees with it. She will call with any problems that may develop before her next visit here.  Chauncey Cruel, MD   07/24/2016 6:42 PM Medical Oncology and Hematology [facility] [address] [place], [place] [address] Tel. [phone]    Fax. [phone]

## 2016-07-25 ENCOUNTER — Telehealth: Payer: Self-pay | Admitting: Oncology

## 2016-07-25 NOTE — Telephone Encounter (Signed)
appt made and calender sent to pt by mail

## 2016-07-28 NOTE — Progress Notes (Signed)
Cardiology Office Note   Date:  07/29/2016   ID:  Rachel Nixon, Rachel Nixon [birth year], MRN [id number]  PCP:  Marjorie Smolder, MD  Cardiologist:   Skeet Latch, MD   Chief Complaint  Patient presents with  . New Patient (Initial Visit)    cramping in legs during the night. occasionally have edema in the summer.       History of Present Illness: Rachel Nixon is a 60 y.o. female with hypertension, hyperlipidemia, atypical ductal hyperplasia, diabetes mellitus, GERD and obesity who presents for evaluation of an abnormal EKG.  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  She has been feeling well and denies chest pain or shortness of breath.  She had an episode of chet pain 3 years ago that occurred when laying down.  She does not get any formal exercise since her dog died.  In the past she walked him twice daily and never noted any chest pain or shortness of breath.  She notes occasional lower extremity edea, especially after standing for prolonged periods of time.  It improves with elevation of her legs.  Rachel Nixon had an echo and Holter 03/2015 for palpitations.  She reports that both were normal.  She continues to have episodes of palpitations at times. They're not very bothersome to her and she usually notices it when laying in bed at night. In the past she was on metoprolol but developed fatigue. She also did not tolerate nebivolol. All  she does not use over-the-counter cough or cold medications and typically has 2 caffeinated beverages daily.   Past Medical History:  Diagnosis Date  . Allergy   . Anxiety   . Arthritis    hips, knees, hands  . Depression   . Diabetes mellitus without complication ([place])   . GERD (gastroesophageal reflux disease)   . Hyperlipidemia   . Hypertension   . Migraine headache   . Obesity     Past Surgical History:  Procedure Laterality Date  . ABDOMINAL HYSTERECTOMY    .  BREAST BIOPSY Right 06/12/2016   Procedure: RIGHT BREAST BIOPSY WITH NEEDLE LOCALIZATION;  Surgeon: Rolm Bookbinder, MD;  Location: [facility];  Service: General;  Laterality: Right;  RIGHT BREAST BIOPSY WITH NEEDLE LOCALIZATION  . CHOLECYSTECTOMY    . FRACTURE SURGERY    . RADIOACTIVE SEED GUIDED EXCISIONAL BREAST BIOPSY Right 06/12/2016   Procedure: RIGHT RADIOACTIVE SEED GUIDED EXCISIONAL BREAST BIOPSY X 2;  Surgeon: Rolm Bookbinder, MD;  Location: [facility];  Service: General;  Laterality: Right;  RIGHT RADIOACTIVE SEED GUIDED EXCISIONAL BREAST BIOPSY X 2  . TONSILLECTOMY       Current Outpatient Prescriptions  Medication Sig Dispense Refill  . amitriptyline (ELAVIL) 25 MG tablet Take 40 mg by mouth at bedtime.     Marland Kitchen aspirin 81 MG chewable tablet Chew 81 mg by mouth daily.    Marland Kitchen atorvastatin (LIPITOR) 20 MG tablet Take 20 mg by mouth daily.    Marland Kitchen buPROPion (WELLBUTRIN XL) 150 MG 24 hr tablet Take 150 mg by mouth daily.    . Cholecalciferol (VITAMIN D3) 3000 units TABS Take by mouth.    . ergocalciferol (VITAMIN D2) 50000 UNITS capsule Take 50,000 Units by mouth 2 (two) times a week. Sunday and thursday    . glucosamine-chondroitin 500-400 MG tablet Take 1 tablet by mouth 3 (three) times daily.    Marland Kitchen  Lactobacillus (PROBIOTIC ACIDOPHILUS PO) Take by mouth.    Marland Kitchen lisinopril (PRINIVIL,ZESTRIL) 10 MG tablet Take 10 mg by mouth daily.    . metFORMIN (GLUCOPHAGE) 500 MG tablet Take 1,000 mg by mouth daily with breakfast.     . Multiple Vitamin (MULTIVITAMIN WITH MINERALS) TABS tablet Take 1 tablet by mouth daily.    Marland Kitchen omeprazole (PRILOSEC) 10 MG capsule Take 10 mg by mouth daily.    . raloxifene (EVISTA) 60 MG tablet Take 1 tablet (60 mg total) by mouth daily. 90 tablet 4  . Turmeric 500 MG CAPS Take by mouth.     No current facility-administered medications for this visit.     Allergies:   Erythromycin and Lodine [etodolac]    Social History:  The patient   reports that she has never smoked. She does not have any smokeless tobacco history on file. She reports that she does not drink alcohol or use drugs.   Family History:  The patient's family history includes Alzheimer's disease in her maternal grandfather; Breast cancer in her maternal grandmother; CAD in her father; COPD in her other; Cancer in her other; Dementia in her other; Diabetes in her other; Diabetes type II in her brother; Heart disease in her other and paternal grandfather; Hyperlipidemia in her other; Hypertension in her other; Lymphoma in her paternal grandmother; Stroke in her other.    ROS:  Please see the history of present illness.   Otherwise, review of systems are positive for none.   All other systems are reviewed and negative.    PHYSICAL EXAM: VS:  BP 120/80   Pulse (!) 109   Ht 5' 3.5" (1.613 m)   Wt 218 lb 9.6 oz (99.2 kg)   BMI 38.12 kg/m  , BMI Body mass index is 38.12 kg/m. GENERAL:  Well appearing HEENT:  Pupils equal round and reactive, fundi not visualized, oral mucosa unremarkable NECK:  No jugular venous distention, waveform within normal limits, carotid upstroke brisk and symmetric, no bruits, no thyromegaly LYMPHATICS:  No cervical adenopathy LUNGS:  Clear to auscultation bilaterally HEART:  Tacycardic.  Regular rhythm  PMI not displaced or sustained,S1 and S2 within normal limits, no S3, no S4, no clicks, no rubs, no murmurs ABD:  Flat, positive bowel sounds normal in frequency in pitch, no bruits, no rebound, no guarding, no midline pulsatile mass, no hepatomegaly, no splenomegaly EXT:  2 plus pulses throughout, no edema, no cyanosis no clubbing SKIN:  No rashes no nodules NEURO:  Cranial nerves II through XII grossly intact, motor grossly intact throughout PSYCH:  Cognitively intact, oriented to person place and time    EKG:  EKG is ordered today. The ekg ordered today demonstrates sinus tachycardia. Rate 10 9 bpm. Borderline right axis  deviation. 06/06/16: Sinus tachycardia.  PVC.  Prior inferior infarct   Recent Labs: 07/24/2016: ALT 35; BUN 14.5; Creatinine 0.9; HGB 13.1; Platelets 280; Potassium 4.3; Sodium 138    Lipid Panel No results found for: CHOL, TRIG, HDL, CHOLHDL, VLDL, LDLCALC, LDLDIRECT    Wt Readings from Last 3 Encounters:  07/29/16 218 lb 9.6 oz (99.2 kg)  07/24/16 219 lb 9.6 oz (99.6 kg)  06/12/16 223 lb (101.2 kg)      ASSESSMENT AND PLAN:  # Resting tachycardia:  Ms. Sausedo heart rate is elevated today and was elevated in clinic with her primary care provider. She notices palpitations at times but has not tolerated beta blockers in the past.  Her electrolytes and blood counts have  been unremarkable. She is scheduled to have lab work with her PCP next week. I recommend that she have her thyroid checked at that time.  # Abnormal EKG: There was concern for prior inferior infarct on her last EKG. The EKG today does not show any evidence of prior heart attack. She has not had any symptoms of a heart attack and I think that this was an error. No further testing is indicated at this time.   Current medicines are reviewed at length with the patient today.  The patient does not have concerns regarding medicines.  The following changes have been made:  no change  Labs/ tests ordered today include:  No orders of the defined types were placed in this encounter.    Disposition:   FU with [place] C. Oval Linsey, MD, [facility] as needed.    This note was written with the assistance of speech recognition software.  Please excuse any transcriptional errors.  Signed, Kyo Cocuzza C. Oval Linsey, MD, [facility]  07/29/2016 11:35 AM    [facility]

## 2016-07-29 ENCOUNTER — Encounter: Payer: Self-pay | Admitting: Cardiovascular Disease

## 2016-07-29 ENCOUNTER — Ambulatory Visit (INDEPENDENT_AMBULATORY_CARE_PROVIDER_SITE_OTHER): Payer: Managed Care, Other (non HMO) | Admitting: Cardiovascular Disease

## 2016-07-29 VITALS — BP 120/80 | HR 109 | Ht 63.5 in | Wt 218.6 lb

## 2016-07-29 DIAGNOSIS — R000 Tachycardia, unspecified: Secondary | ICD-10-CM | POA: Diagnosis not present

## 2016-07-29 DIAGNOSIS — R9431 Abnormal electrocardiogram [ECG] [EKG]: Secondary | ICD-10-CM

## 2016-07-29 DIAGNOSIS — I4711 Inappropriate sinus tachycardia, so stated: Secondary | ICD-10-CM

## 2016-07-29 HISTORY — DX: Tachycardia, unspecified: R00.0

## 2016-07-29 HISTORY — DX: Inappropriate sinus tachycardia, so stated: I47.11

## 2016-07-29 NOTE — Patient Instructions (Signed)
Medication Instructions:  Your physician recommends that you continue on your current medications as directed. Please refer to the Current Medication list given to you today.  Labwork: none  Testing/Procedures: none  Follow-Up: As needed   If you need a refill on your cardiac medications before your next appointment, please call your pharmacy.  

## 2016-10-24 ENCOUNTER — Ambulatory Visit: Payer: Managed Care, Other (non HMO) | Admitting: Oncology

## 2016-10-24 ENCOUNTER — Encounter: Payer: Self-pay | Admitting: Oncology

## 2016-12-12 ENCOUNTER — Telehealth: Payer: Self-pay | Admitting: Oncology

## 2016-12-12 NOTE — Telephone Encounter (Signed)
Patient called to have appointment rescheduled.

## 2016-12-16 ENCOUNTER — Ambulatory Visit: Payer: Managed Care, Other (non HMO) | Admitting: Oncology

## 2016-12-23 ENCOUNTER — Encounter: Payer: Self-pay | Admitting: Oncology

## 2016-12-23 ENCOUNTER — Ambulatory Visit (HOSPITAL_BASED_OUTPATIENT_CLINIC_OR_DEPARTMENT_OTHER): Payer: Managed Care, Other (non HMO) | Admitting: Oncology

## 2016-12-23 VITALS — BP 128/86 | HR 108 | Temp 98.4°F | Resp 18 | Ht 63.5 in | Wt 220.5 lb

## 2016-12-23 DIAGNOSIS — N6091 Unspecified benign mammary dysplasia of right breast: Secondary | ICD-10-CM | POA: Diagnosis not present

## 2016-12-23 MED ORDER — RALOXIFENE HCL 60 MG PO TABS
60.0000 mg | ORAL_TABLET | Freq: Every day | ORAL | 4 refills | Status: DC
Start: 2016-12-23 — End: 2018-02-16

## 2016-12-23 NOTE — Progress Notes (Signed)
[facility]  Telephone:([phone]) [phone] Fax:(336) [phone]     ID: LATRENA NICODEMUS DOB: 1956/08/22  MR#: [id number]  [id number]  Patient Care Team: Darcus Austin, MD as PCP - General (Family Medicine) Rolm Bookbinder, MD as Consulting Physician (General Surgery) Chauncey Cruel, MD as Consulting Physician (Oncology) Clarene [place], MD as Consulting Physician (Gastroenterology) Skeet Latch, MD as Attending Physician (Cardiology) OTHER MD:  CHIEF COMPLAINT: Atypical ductal hyperplasia  CURRENT TREATMENT:  Raloxifene/Evista   BREAST CANCER HISTORY: From the original intake note:  Everley had routine bilateral screening mammography at [facility] 07/04/2015 showing an area of possible distortion in the right breast. On 07/10/2015 she underwent right diagnostic mammography with tomosynthesis and ultrasonography. The breast density was category C. This confirmed an area of architectural distortion in the lower outer quadrant of the right breast associated with coarse and punctate calcifications more medially. Ultrasonography was negative. Biopsy of the area of distortion in the right breast 07/18/2015 showed ([id number]) no atypia or malignancy.  Six-month follow-up mammography was recommended, and performed 02/22/2016. The area of distortion in the lower outer right breast was unchanged. There was an additional area of distortion more posterior which was also felt to be mammographically stable. The 4 mm group of likely dystrophic calcifications also appear unchanged. Physical exam was benign. Whenever targeted ultrasound of the right breast showed a dilated duct in the subareolar right breast at the 6:00 position, measuring 0.9 cm. There was no evidence of lymphadenopathy.  Because of the questionable findings, bilateral MRI was recommended and performed 04/12/2016. This showed in the lower outer right breast at 1.0 cm area of distortion which had been biopsied in  August 2016. There was abnormal intraductal enhancement measuring 1.5 cm in the right subareolar region. The left breast was unremarkable and there was no adenopathy of concern.  On 05/01/2016, she underwent MRI guided biopsy of the lower outer quadrant area in question and this showed a complex sclerosing lesion ([id number] [id number]). The patient was then referred to surgery and after appropriate discussion underwent biopsy of the 3 areas in question in the right breast 06/12/2016. 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 The margins were clear. Patient is being referred for discussion of breast cancer high-risk management   Her subsequent history is as detailed below   INTERVAL HISTORY: Almyra Free is a returns today for follow-up of her atypical ductal hyperplasia. After our discussion last visit she started raloxifene in August.  She is tolerating that remarkably well. She does not have any more hot flashes than she had before, and those were few. She has no problems with vaginal dryness. She is obtaining the drug at a good price.  REVIEW OF SYSTEMS: A detailed review of systems today was otherwise entirely stable.  PAST MEDICAL HISTORY: Past Medical History:  Diagnosis Date  . Allergy   . Anxiety   . Arthritis    hips, knees, hands  . Depression   . Diabetes mellitus without complication ([place])   . GERD (gastroesophageal reflux disease)   . Hyperlipidemia   . Hypertension   . 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 Migraine headache   . Obesity     PAST SURGICAL HISTORY: Past Surgical History:  Procedure Laterality Date  . ABDOMINAL HYSTERECTOMY    . BREAST BIOPSY Right 06/12/2016   Procedure: RIGHT BREAST BIOPSY WITH NEEDLE LOCALIZATION;  Surgeon: Rolm Bookbinder, MD;  Location: [facility];  Service: General;  Laterality: Right;  RIGHT BREAST BIOPSY WITH NEEDLE  LOCALIZATION  . CHOLECYSTECTOMY    . FRACTURE SURGERY    . RADIOACTIVE SEED GUIDED EXCISIONAL BREAST BIOPSY Right 06/12/2016   Procedure: RIGHT RADIOACTIVE SEED GUIDED EXCISIONAL BREAST BIOPSY X 2;  Surgeon: Rolm Bookbinder, MD;  Location: [facility];  Service: General;  Laterality: Right;  RIGHT RADIOACTIVE SEED GUIDED EXCISIONAL BREAST BIOPSY X 2  . TONSILLECTOMY      FAMILY HISTORY Family History  Problem Relation Age of Onset  . CAD Father   . Diabetes type II Brother   . Cancer Other   . Hypertension Other   . COPD Other   . Hyperlipidemia Other   . Diabetes Other   . Stroke Other   . Heart disease Other   . Dementia Other   . Breast cancer Maternal Grandmother   . Alzheimer's disease Maternal Grandfather   . Lymphoma Paternal Grandmother   . Heart disease Paternal Grandfather   The patient's father is living at age 28 and her mother is living at age 63 as of August 2016. The patient has one brother, no sisters. The paternal grandmother had breast cancer at a late age. A maternal grandmother had non-Hodgkin's lymphoma.  GYNECOLOGIC HISTORY:  No LMP recorded. Patient has had a hysterectomy. Menarche age 61, first live birth age 45, the patient is GX P1. She underwent simple hysterectomy without salpingo-oophorectomy in 2001 and took hormone replacement approximately 7 years. She had no complications  SOCIAL HISTORY:  Alessandria works as a Herbalist. She is divorced and lives by herself, with no pets. Son Harrell Gave lives in [place]. He is a former 63. The patient has 2 grandchildren, one of whom has a rare genetics disorder. The patient attends the [employer]: Her son is her healthcare power of attorney. He can be reached at [phone]   HEALTH MAINTENANCE: Social History  Substance Use Topics  . Smoking status: Never Smoker  . Smokeless tobacco: Not on file  . Alcohol use No      Colonoscopy:  PAP:  Bone density:   Allergies  Allergen Reactions  . Erythromycin Nausea Only  . Lodine [Etodolac] Hives    Current Outpatient Prescriptions  Medication Sig Dispense Refill  . amitriptyline (ELAVIL) 25 MG tablet Take 40 mg by mouth at bedtime.     Marland Kitchen aspirin 81 MG chewable tablet Chew 81 mg by mouth daily.    Marland Kitchen atorvastatin (LIPITOR) 20 MG tablet Take 20 mg by mouth daily.    Marland Kitchen buPROPion (WELLBUTRIN XL) 150 MG 24 hr tablet Take 150 mg by mouth daily.    . Cholecalciferol (VITAMIN D3) 3000 units TABS Take by mouth.    . ergocalciferol (VITAMIN D2) 50000 UNITS capsule Take 50,000 Units by mouth 2 (two) times a week. Sunday and thursday    . glucosamine-chondroitin 500-400 MG tablet Take 1 tablet by mouth 3 (three) times daily.    . Lactobacillus (PROBIOTIC ACIDOPHILUS PO) Take by mouth.    Marland Kitchen lisinopril (PRINIVIL,ZESTRIL) 10 MG tablet Take 10 mg by mouth daily.    . metFORMIN (GLUCOPHAGE) 500 MG tablet Take 1,000 mg by mouth daily with breakfast.     . Multiple Vitamin (MULTIVITAMIN WITH MINERALS) TABS tablet Take 1 tablet by mouth daily.    Marland Kitchen omeprazole (PRILOSEC) 10 MG capsule Take 10 mg by mouth daily.    . raloxifene (EVISTA) 60 MG tablet  Take 1 tablet (60 mg total) by mouth daily. 90 tablet 4  . Turmeric 500 MG CAPS Take by mouth.     No current facility-administered medications for this visit.     OBJECTIVE: Middle-aged white woman Who appears well Vitals:   12/23/16 1012  BP: 128/86  Pulse: (!) 108  Resp: 18  Temp: 98.4 F (36.9 C)     Body mass index is 38.45 kg/m.    ECOG FS:0 - Asymptomatic  Sclerae unicteric, pupils round and equal Oropharynx clear and moist-- no thrush or other lesions No cervical or supraclavicular adenopathy Lungs no rales or rhonchi Heart regular rate and rhythm Abd soft, nontender, positive bowel sounds MSK no focal spinal tenderness, no upper extremity lymphedema Neuro: nonfocal, well oriented, appropriate  affect Breasts: The right breast is status post prior surgery. There is no evidence of local recurrence. The right axilla is benign. The left breast is unremarkable.   LAB RESULTS:  CMP     Component Value Date/Time   NA 138 07/24/2016 1615   K 4.3 07/24/2016 1615   CL 106 06/06/2016 1215   CO2 25 07/24/2016 1615   GLUCOSE 105 07/24/2016 1615   BUN 14.5 07/24/2016 1615   CREATININE 0.9 07/24/2016 1615   CALCIUM 9.9 07/24/2016 1615   PROT 7.4 07/24/2016 1615   ALBUMIN 3.7 07/24/2016 1615   AST 31 07/24/2016 1615   ALT 35 07/24/2016 1615   ALKPHOS 131 07/24/2016 1615   BILITOT 0.36 07/24/2016 1615   GFRNONAA >60 06/06/2016 1215   GFRAA >60 06/06/2016 1215    INo results found for: SPEP, UPEP  Lab Results  Component Value Date   WBC 9.0 07/24/2016   NEUTROABS 5.0 07/24/2016   HGB 13.1 07/24/2016   HCT 40.7 07/24/2016   MCV 78.3 (L) 07/24/2016   PLT 280 07/24/2016      Chemistry      Component Value Date/Time   NA 138 07/24/2016 1615   K 4.3 07/24/2016 1615   CL 106 06/06/2016 1215   CO2 25 07/24/2016 1615   BUN 14.5 07/24/2016 1615   CREATININE 0.9 07/24/2016 1615      Component Value Date/Time   CALCIUM 9.9 07/24/2016 1615   ALKPHOS 131 07/24/2016 1615   AST 31 07/24/2016 1615   ALT 35 07/24/2016 1615   BILITOT 0.36 07/24/2016 1615       No results found for: LABCA2  No components found for: LABCA125  No results for input(s): INR in the last 168 hours.  Urinalysis No results found for: COLORURINE, APPEARANCEUR, LABSPEC, PHURINE, GLUCOSEU, HGBUR, BILIRUBINUR, KETONESUR, PROTEINUR, UROBILINOGEN, NITRITE, LEUKOCYTESUR   STUDIES: No results found.   ELIGIBLE FOR AVAILABLE RESEARCH PROTOCOL: no  ASSESSMENT: 61 y.o. [place] woman Status post right breast biopsy 06/12/2016 for atypical ductal hyperplasia in the setting of a radial scar  (1) breast density category C  (2) started raloxifene for risk reduction 07/24/2016  PLAN: Adelay is  tolerating the raloxifene well. She understands this drug will cut her risk of breast cancer in half, from approximately 1% per year, through approximately half a percent failure.  The plan is to continue raloxifene for 5 years.  At this point I feel comfortable releasing her to her primary care physician Dr. Inda Merlin. The patient sees Dr. Inda Merlin every 6 months. If she has a breast exam every 6 months and mammography yearly, that is all that is needed as far as breast cancer screening is concerned.  Accordingly I am not making  her a return appointment with me here. Of course I will be glad to see her again at any point in the future if on when the need arises  Chauncey Cruel, MD   12/23/2016 10:32 AM Medical Oncology and Hematology [facility] [place], [place] [address] Tel. [phone]    Fax. [phone] Really well with

## 2017-08-04 ENCOUNTER — Other Ambulatory Visit: Payer: Self-pay | Admitting: Family Medicine

## 2017-08-04 DIAGNOSIS — Z1231 Encounter for screening mammogram for malignant neoplasm of breast: Secondary | ICD-10-CM

## 2017-08-18 ENCOUNTER — Ambulatory Visit: Payer: Managed Care, Other (non HMO)

## 2017-08-21 ENCOUNTER — Ambulatory Visit: Payer: Managed Care, Other (non HMO)

## 2017-09-22 ENCOUNTER — Ambulatory Visit: Payer: Managed Care, Other (non HMO)

## 2017-10-09 ENCOUNTER — Ambulatory Visit
Admission: RE | Admit: 2017-10-09 | Discharge: 2017-10-09 | Disposition: A | Discharge status: Home / Self Care | Payer: Managed Care, Other (non HMO) | Source: Ambulatory Visit | Attending: Family Medicine | Admitting: Family Medicine

## 2017-10-09 DIAGNOSIS — Z1231 Encounter for screening mammogram for malignant neoplasm of breast: Secondary | ICD-10-CM

## 2018-02-16 ENCOUNTER — Other Ambulatory Visit: Payer: Self-pay | Admitting: *Deleted

## 2018-02-16 MED ORDER — RALOXIFENE HCL 60 MG PO TABS: 60.0000 mg | ORAL_TABLET | Freq: Every day | ORAL | 4 refills | Status: DC

## 2018-06-29 ENCOUNTER — Other Ambulatory Visit: Payer: Self-pay | Admitting: Family Medicine

## 2018-06-29 DIAGNOSIS — Z1231 Encounter for screening mammogram for malignant neoplasm of breast: Secondary | ICD-10-CM

## 2018-08-28 ENCOUNTER — Other Ambulatory Visit: Payer: Self-pay | Admitting: *Deleted

## 2018-08-28 DIAGNOSIS — N6091 Unspecified benign mammary dysplasia of right breast: Secondary | ICD-10-CM

## 2018-08-28 DIAGNOSIS — T148XXA Other injury of unspecified body region, initial encounter: Secondary | ICD-10-CM

## 2018-08-31 ENCOUNTER — Inpatient Hospital Stay: Payer: Managed Care, Other (non HMO) | Attending: Oncology

## 2018-08-31 DIAGNOSIS — N6091 Unspecified benign mammary dysplasia of right breast: Secondary | ICD-10-CM | POA: Insufficient documentation

## 2018-08-31 DIAGNOSIS — T148XXA Other injury of unspecified body region, initial encounter: Secondary | ICD-10-CM

## 2018-08-31 LAB — CMP (CANCER CENTER ONLY)
ALBUMIN: 3.5 g/dL (ref 3.5–5.0)
ALK PHOS: 107 U/L (ref 38–126)
ALT: 30 U/L (ref 0–44)
ANION GAP: 9 (ref 5–15)
AST: 25 U/L (ref 15–41)
BUN: 16 mg/dL (ref 8–23)
CALCIUM: 9.2 mg/dL (ref 8.9–10.3)
CO2: 27 mmol/L (ref 22–32)
CREATININE: 0.73 mg/dL (ref 0.44–1.00)
Chloride: 105 mmol/L (ref 98–111)
GFR, Estimated: >60 mL/min (ref >60)
Glucose, Bld: 118 mg/dL — ABNORMAL HIGH (ref 70–99)
Potassium: 4.1 mmol/L (ref 3.5–5.1)
Sodium: 141 mmol/L (ref 135–145)
TOTAL PROTEIN: 6.7 g/dL (ref 6.5–8.1)

## 2018-08-31 LAB — CBC WITH DIFFERENTIAL (CANCER CENTER ONLY)
BASOS ABS: 0 10*3/uL (ref 0.0–0.1)
BASOS PCT: 0 %
EOS PCT: 2 %
Eosinophils Absolute: 0.1 10*3/uL (ref 0.0–0.5)
HEMATOCRIT: 39.3 % (ref 34.8–46.6)
Hemoglobin: 12.4 g/dL (ref 11.6–15.9)
LYMPHS PCT: 33 %
Lymphs Abs: 2.4 10*3/uL (ref 0.9–3.3)
MCH: 25.7 pg (ref 25.1–34.0)
MCHC: 31.6 g/dL (ref 31.5–36.0)
MCV: 81.4 fL (ref 79.5–101.0)
Monocytes Absolute: 0.6 10*3/uL (ref 0.1–0.9)
Monocytes Relative: 8 %
NEUTROS ABS: 4.2 10*3/uL (ref 1.5–6.5)
Neutrophils Relative %: 57 %
PLATELETS: 241 10*3/uL (ref 145–400)
RBC: 4.83 MIL/uL (ref 3.70–5.45)
RDW: 14 % (ref 11.2–14.5)
WBC: 7.4 10*3/uL (ref 3.9–10.3)

## 2018-08-31 LAB — RETICULOCYTES
RBC.: 4.83 MIL/uL (ref 3.70–5.45)
RETIC COUNT ABSOLUTE: 48.3 10*3/uL (ref 33.7–90.7)
Retic Ct Pct: 1 % (ref 0.7–2.1)

## 2018-08-31 LAB — TECHNOLOGIST SMEAR REVIEW: Tech Review: NORMAL

## 2018-08-31 LAB — LACTATE DEHYDROGENASE: LDH: 162 U/L (ref 98–192)

## 2018-09-08 ENCOUNTER — Ambulatory Visit: Payer: Managed Care, Other (non HMO) | Admitting: Cardiovascular Disease

## 2018-09-10 NOTE — Progress Notes (Signed)
[place]  [phone] [phone]     ID: Rachel Nixon DOB: [birth year]  MR#: [id number]  KGU#:[id number]  Patient Care Team: Darcus Austin, MD as PCP - General (Family Medicine) Rolm Bookbinder, MD as Consulting Physician (General Surgery) Magrinat, Virgie Dad, MD as Consulting Physician (Oncology) Clarene Essex, MD as Consulting Physician (Gastroenterology) Skeet Latch, MD as Attending Physician (Cardiology) OTHER MD:  CHIEF COMPLAINT: Atypical ductal hyperplasia  CURRENT TREATMENT:  Raloxifene/Evista   BREAST CANCER HISTORY: From the original intake note:  Rachel Nixon had routine bilateral screening mammography at the [facility] 07/04/2015 showing an area of possible distortion in the right breast. On 07/10/2015 she underwent right diagnostic mammography with tomosynthesis and ultrasonography. The breast density was category C. This confirmed an area of architectural distortion in the lower outer quadrant of the right breast associated with coarse and punctate calcifications more medially. Ultrasonography was negative. Biopsy of the area of distortion in the right breast 07/18/2015 showed ([id number]) no atypia or malignancy.  Six-month follow-up mammography was recommended, and performed 02/22/2016. The area of distortion in the lower outer right breast was unchanged. There was an additional area of distortion more posterior which was also felt to be mammographically stable. The 4 mm group of likely dystrophic calcifications also appear unchanged. Physical exam was benign. Whenever targeted ultrasound of the right breast showed a dilated duct in the subareolar right breast at the 6:00 position, measuring 0.9 cm. There was no evidence of lymphadenopathy.  Because of the questionable findings, bilateral MRI was recommended and performed 04/12/2016. This showed in the lower outer right breast at 1.0 cm area of distortion which had been biopsied  in August 2016. There was abnormal intraductal enhancement measuring 1.5 cm in the right subareolar region. The left breast was unremarkable and there was no adenopathy of concern.  On 05/01/2016, she underwent MRI guided biopsy of the lower outer quadrant area in question and this showed a complex sclerosing lesion ([id number]). The patient was then referred to surgery and after appropriate discussion underwent biopsy of the 3 areas in question in the right breast 06/12/2016. 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 The margins were clear. Patient is being referred for discussion of breast cancer high-risk management   Her subsequent history is as detailed below   INTERVAL HISTORY: Rachel Nixon returns today for follow-up of her atypical ductal hyperplasia. She continues on raloxifene, with good tolerance.   She occasionally has brief shooting pains in the right breast. She is scheduled for her annual mammography on 10/13/2018.   REVIEW OF SYSTEMS: Rachel Nixon reports that she is doing well. She notes that both of her parents died within the last year. Her mother died of lymphoma. The patient's maternal grandmother also had lymphoma and similar bruising to the patient's mother. Rachel Nixon has noticed that she has some bruising on her left arm that would resolve after a few days, but would return. She denies any bruising this week. She also reports that her father died of cardiac arrest 9 months after her mother.  She denies unusual headaches, visual changes, nausea, vomiting, or dizziness. There has been no unusual cough, phlegm production, or pleurisy. There has been no change in bowel or bladder habits. She denies unexplained fatigue or unexplained weight loss, rash, or fever. A detailed review of systems was otherwise stable.    PAST MEDICAL HISTORY: Past Medical History:  Diagnosis Date  .  Allergy   .  Anxiety   . Arthritis    hips, knees, hands  . Depression   . Diabetes mellitus without complication ([place])   . GERD (gastroesophageal reflux disease)   . Hyperlipidemia   . Hypertension   . 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 Migraine headache   . Obesity     PAST SURGICAL HISTORY: Past Surgical History:  Procedure Laterality Date  . ABDOMINAL HYSTERECTOMY    . BREAST BIOPSY Right 06/12/2016   Procedure: RIGHT BREAST BIOPSY WITH NEEDLE LOCALIZATION;  Surgeon: Rolm Bookbinder, MD;  Location: [place];  Service: General;  Laterality: Right;  RIGHT BREAST BIOPSY WITH NEEDLE LOCALIZATION  . BREAST BIOPSY Left    No noticable scars anymore   . CHOLECYSTECTOMY    . FRACTURE SURGERY    . RADIOACTIVE SEED GUIDED EXCISIONAL BREAST BIOPSY Right 06/12/2016   Procedure: RIGHT RADIOACTIVE SEED GUIDED EXCISIONAL BREAST BIOPSY X 2;  Surgeon: Rolm Bookbinder, MD;  Location: [place];  Service: General;  Laterality: Right;  RIGHT RADIOACTIVE SEED GUIDED EXCISIONAL BREAST BIOPSY X 2  . TONSILLECTOMY      FAMILY HISTORY Family History  Problem Relation Age of Onset  . CAD Father   . Diabetes type II Brother   . Cancer Other   . Hypertension Other   . COPD Other   . Hyperlipidemia Other   . Diabetes Other   . Stroke Other   . Heart disease Other   . Dementia Other   . Breast cancer Maternal Grandmother   . Alzheimer's disease Maternal Grandfather   . Lymphoma Paternal Grandmother   . Heart disease Paternal Grandfather   The patient's father died at age 32 due to cardiac arrest. The patient's mother died at age 19 due to [place] (she died before an official diagnosis). The patient has one brother, no sisters. The paternal grandmother had breast cancer at a late age. A maternal grandmother had non-Hodgkin's lymphoma.  GYNECOLOGIC HISTORY:  No LMP recorded. Patient has had a hysterectomy. Menarche age 74, first live birth age 29, the patient is GX  P1. She underwent simple hysterectomy without salpingo-oophorectomy in 2001 and took hormone replacement approximately 7 years. She had no complications  SOCIAL HISTORY: Updated 09/11/2018 Solace works as a Herbalist. She is divorced and lives by herself, with no pets. Son Harrell Gave lives in [place] and works as a Government social research officer for [employer]. Gerald Stabs' wife is a Therapist, sports for the same company. The patient has 2 grandchildren, one of whom has a rare genetics disorder. The patient attends the [employer] DIRECTIVES: Her son is her healthcare power of attorney. He can be reached at [phone]   HEALTH MAINTENANCE: Social History   Tobacco Use  . Smoking status: Never Smoker  Substance Use Topics  . Alcohol use: No  . Drug use: No     Colonoscopy:  PAP:  Bone density:   Allergies  Allergen Reactions  . Erythromycin Nausea Only  . Lodine [Etodolac] Hives    Current Outpatient Medications  Medication Sig Dispense Refill  . amitriptyline (ELAVIL) 25 MG tablet Take 40 mg by mouth at bedtime.     Marland Kitchen aspirin 81 MG chewable tablet Chew 81 mg by mouth daily.    Marland Kitchen atorvastatin (LIPITOR) 20 MG tablet Take 20 mg by mouth daily.    Marland Kitchen buPROPion (WELLBUTRIN XL) 150 MG 24 hr tablet Take 150 mg by mouth daily.    Marland Kitchen  Cholecalciferol (VITAMIN D3) 3000 units TABS Take by mouth.    . ergocalciferol (VITAMIN D2) 50000 UNITS capsule Take 50,000 Units by mouth 2 (two) times a week. Sunday and thursday    . glucosamine-chondroitin 500-400 MG tablet Take 1 tablet by mouth 3 (three) times daily.    . Lactobacillus (PROBIOTIC ACIDOPHILUS PO) Take by mouth.    Marland Kitchen lisinopril (PRINIVIL,ZESTRIL) 10 MG tablet Take 10 mg by mouth daily.    . metFORMIN (GLUCOPHAGE) 500 MG tablet Take 1,000 mg by mouth daily with breakfast.     . Multiple Vitamin (MULTIVITAMIN WITH MINERALS) TABS tablet Take 1 tablet by mouth daily.    Marland Kitchen omeprazole (PRILOSEC) 10 MG capsule Take 10 mg  by mouth daily.    . raloxifene (EVISTA) 60 MG tablet Take 1 tablet (60 mg total) by mouth daily. 90 tablet 4  . Turmeric 500 MG CAPS Take by mouth.     No current facility-administered medications for this visit.     OBJECTIVE: Middle-aged white woman in no acute distress Vitals:   09/11/18 1141  BP: 125/82  Pulse: (!) 112  Resp: 18  Temp: 98.5 F (36.9 C)  SpO2: 98%     Body mass index is 38.76 kg/m.    ECOG FS:0 - Asymptomatic  Sclerae unicteric, EOMs intact No cervical or supraclavicular adenopathy Lungs no rales or rhonchi Heart regular rate and rhythm Abd soft, nontender, positive bowel sounds MSK no focal spinal tenderness, no upper extremity lymphedema Neuro: nonfocal, well oriented, appropriate affect Breasts: The right breast has undergone prior lumpectomy.  There is no evidence of local recurrence.  Both axillae are benign.  The left breast is benign.   LAB RESULTS:  CMP     Component Value Date/Time   NA 141 08/31/2018 1249   NA 138 07/24/2016 1615   K 4.1 08/31/2018 1249   K 4.3 07/24/2016 1615   CL 105 08/31/2018 1249   CO2 27 08/31/2018 1249   CO2 25 07/24/2016 1615   GLUCOSE 118 (H) 08/31/2018 1249   GLUCOSE 105 07/24/2016 1615   BUN 16 08/31/2018 1249   BUN 14.5 07/24/2016 1615   CREATININE 0.73 08/31/2018 1249   CREATININE 0.9 07/24/2016 1615   CALCIUM 9.2 08/31/2018 1249   CALCIUM 9.9 07/24/2016 1615   PROT 6.7 08/31/2018 1249   PROT 7.4 07/24/2016 1615   ALBUMIN 3.5 08/31/2018 1249   ALBUMIN 3.7 07/24/2016 1615   AST 25 08/31/2018 1249   AST 31 07/24/2016 1615   ALT 30 08/31/2018 1249   ALT 35 07/24/2016 1615   ALKPHOS 107 08/31/2018 1249   ALKPHOS 131 07/24/2016 1615   BILITOT <0.2 (L) 08/31/2018 1249   BILITOT 0.36 07/24/2016 1615   GFRNONAA >60 08/31/2018 1249   GFRAA >60 08/31/2018 1249    INo results found for: SPEP, UPEP  Lab Results  Component Value Date   WBC 7.4 08/31/2018   NEUTROABS 4.2 08/31/2018   HGB 12.4  08/31/2018   HCT 39.3 08/31/2018   MCV 81.4 08/31/2018   PLT 241 08/31/2018      Chemistry      Component Value Date/Time   NA 141 08/31/2018 1249   NA 138 07/24/2016 1615   K 4.1 08/31/2018 1249   K 4.3 07/24/2016 1615   CL 105 08/31/2018 1249   CO2 27 08/31/2018 1249   CO2 25 07/24/2016 1615   BUN 16 08/31/2018 1249   BUN 14.5 07/24/2016 1615   CREATININE 0.73 08/31/2018 1249  CREATININE 0.9 07/24/2016 1615      Component Value Date/Time   CALCIUM 9.2 08/31/2018 1249   CALCIUM 9.9 07/24/2016 1615   ALKPHOS 107 08/31/2018 1249   ALKPHOS 131 07/24/2016 1615   AST 25 08/31/2018 1249   AST 31 07/24/2016 1615   ALT 30 08/31/2018 1249   ALT 35 07/24/2016 1615   BILITOT <0.2 (L) 08/31/2018 1249   BILITOT 0.36 07/24/2016 1615       No results found for: LABCA2  No components found for: LABCA125  No results for input(s): INR in the last 168 hours.  Urinalysis No results found for: COLORURINE, APPEARANCEUR, LABSPEC, PHURINE, GLUCOSEU, HGBUR, BILIRUBINUR, KETONESUR, PROTEINUR, UROBILINOGEN, NITRITE, LEUKOCYTESUR   STUDIES: No results found.   ELIGIBLE FOR AVAILABLE RESEARCH PROTOCOL: no  ASSESSMENT: 62 y.o. [place] woman Status post right breast biopsy 06/12/2016 for atypical ductal hyperplasia in the setting of a radial scar  (1) breast density category C  (2) started raloxifene for risk reduction 07/24/2016  PLAN: Rachel Nixon is now 2 years into her 5 years of raloxifene, with excellent tolerance.  Of course after 5 years she may continue if she wishes for bone reasons but as far as risk reduction is concerned we do not have data beyond 5 years  She is very concerned because her mother and grandmother both had rapidly fatal lymphoma.  However both of them by the history she gives me were in denial and did not come to medical attention until the disease was very very advanced.  Her mother did have some bruising, very likely due to low platelets.  Rachel Nixon had a  little bit of bruising in her right wrist area, medially, however her platelet count is fine.  I think she may have had an inadvertent minor trauma to that area.  She certainly will let me know if she has any further bruising issues.  We reviewed the symptoms of lymphoma and the lab work associated with it.  She has none of those issues.  I reassured her also that the shooting pain she sometimes has in her right breast are normal  She will see me again next year after her mammogram  She knows to call for any other issues that may develop before the next visit.    Magrinat, Virgie Dad, MD  09/11/18 11:54 AM Medical Oncology and Hematology [facility] [address] [place], [place] [address] Tel. [phone]    Fax. [phone]  Alice Rieger, am acting as scribe for Chauncey Cruel MD.  I, Lurline Del MD, have reviewed the above documentation for accuracy and completeness, and I agree with the above.

## 2018-09-11 ENCOUNTER — Inpatient Hospital Stay: Payer: Managed Care, Other (non HMO) | Attending: Oncology | Admitting: Oncology

## 2018-09-11 ENCOUNTER — Telehealth: Payer: Self-pay | Admitting: Oncology

## 2018-09-11 VITALS — BP 125/82 | HR 112 | Temp 98.5°F | Resp 18 | Ht 63.5 in | Wt 222.3 lb

## 2018-09-11 DIAGNOSIS — E119 Type 2 diabetes mellitus without complications: Secondary | ICD-10-CM | POA: Diagnosis not present

## 2018-09-11 DIAGNOSIS — F419 Anxiety disorder, unspecified: Secondary | ICD-10-CM | POA: Insufficient documentation

## 2018-09-11 DIAGNOSIS — Z79899 Other long term (current) drug therapy: Secondary | ICD-10-CM | POA: Diagnosis not present

## 2018-09-11 DIAGNOSIS — I10 Essential (primary) hypertension: Secondary | ICD-10-CM | POA: Insufficient documentation

## 2018-09-11 DIAGNOSIS — F329 Major depressive disorder, single episode, unspecified: Secondary | ICD-10-CM | POA: Diagnosis not present

## 2018-09-11 DIAGNOSIS — Z807 Family history of other malignant neoplasms of lymphoid, hematopoietic and related tissues: Secondary | ICD-10-CM | POA: Insufficient documentation

## 2018-09-11 DIAGNOSIS — Z90710 Acquired absence of both cervix and uterus: Secondary | ICD-10-CM | POA: Insufficient documentation

## 2018-09-11 DIAGNOSIS — Z7982 Long term (current) use of aspirin: Secondary | ICD-10-CM | POA: Insufficient documentation

## 2018-09-11 DIAGNOSIS — Z7984 Long term (current) use of oral hypoglycemic drugs: Secondary | ICD-10-CM | POA: Diagnosis not present

## 2018-09-11 DIAGNOSIS — N6091 Unspecified benign mammary dysplasia of right breast: Secondary | ICD-10-CM | POA: Insufficient documentation

## 2018-09-11 DIAGNOSIS — Z803 Family history of malignant neoplasm of breast: Secondary | ICD-10-CM | POA: Diagnosis not present

## 2018-09-11 MED ORDER — RALOXIFENE HCL 60 MG PO TABS: 60.0000 mg | ORAL_TABLET | Freq: Every day | ORAL | 4 refills | Status: DC

## 2018-09-11 NOTE — Telephone Encounter (Signed)
Gave pt avs and calendar  °

## 2018-10-12 ENCOUNTER — Ambulatory Visit
Admission: RE | Admit: 2018-10-12 | Discharge: 2018-10-12 | Disposition: A | Discharge status: Home / Self Care | Payer: Managed Care, Other (non HMO) | Source: Ambulatory Visit | Attending: Family Medicine | Admitting: Family Medicine

## 2018-10-12 DIAGNOSIS — Z1231 Encounter for screening mammogram for malignant neoplasm of breast: Secondary | ICD-10-CM

## 2018-12-16 ENCOUNTER — Other Ambulatory Visit: Payer: Self-pay | Admitting: General Surgery

## 2018-12-16 DIAGNOSIS — N6489 Other specified disorders of breast: Secondary | ICD-10-CM

## 2019-01-01 ENCOUNTER — Ambulatory Visit
Admission: RE | Admit: 2019-01-01 | Discharge: 2019-01-01 | Disposition: A | Discharge status: Home / Self Care | Payer: Managed Care, Other (non HMO) | Source: Ambulatory Visit | Attending: General Surgery | Admitting: General Surgery

## 2019-01-01 DIAGNOSIS — N6489 Other specified disorders of breast: Secondary | ICD-10-CM

## 2019-01-06 ENCOUNTER — Ambulatory Visit: Payer: Managed Care, Other (non HMO) | Admitting: Cardiovascular Disease

## 2019-01-06 ENCOUNTER — Encounter

## 2019-03-17 ENCOUNTER — Telehealth: Payer: Self-pay | Admitting: *Deleted

## 2019-03-17 NOTE — Telephone Encounter (Signed)
Left message to call back regarding upcoming visit and making video visit secondary to COVID 19

## 2019-03-19 NOTE — Telephone Encounter (Signed)
LMTCB to change visit with Dr. Oval Linsey on 4/15 to either video or telephone visit. (detailed message ok per DPR)

## 2019-03-23 NOTE — Telephone Encounter (Signed)
LMTCB to change visit to virtual.

## 2019-03-24 ENCOUNTER — Telehealth: Payer: Self-pay | Admitting: Cardiovascular Disease

## 2019-03-24 ENCOUNTER — Ambulatory Visit: Payer: Managed Care, Other (non HMO) | Admitting: Cardiovascular Disease

## 2019-03-24 ENCOUNTER — Encounter

## 2019-03-24 NOTE — Telephone Encounter (Signed)
New message:    Patient calling concering her appt please call patient back.

## 2019-03-24 NOTE — Telephone Encounter (Signed)
Encounter not needed

## 2019-03-24 NOTE — Telephone Encounter (Signed)
Returned call to patient.  She reports that she called to cancel her appointment because of COVID-19 and she hadn't heard anything regarding the appointment. Advised her that several attempts have been made to contact her. She stated that her phone doesn't even ring if a number isn't recognized. Reviewed options for rescheduling her appointment. Patient opted to wait for an in-office visit. Advised her that it could be August 2020. Patient verbalized understanding. Recall entered for 8/20.

## 2019-04-02 IMAGING — MG DIGITAL SCREENING BILATERAL MAMMOGRAM WITH TOMO AND CAD
8 series · 8 of 24 positions shown · non-contrast
Comparison: Previous exam(s).

CLINICAL DATA: Screening.

EXAM:
DIGITAL SCREENING BILATERAL MAMMOGRAM WITH TOMO AND CAD

[R MLO synth-2D]
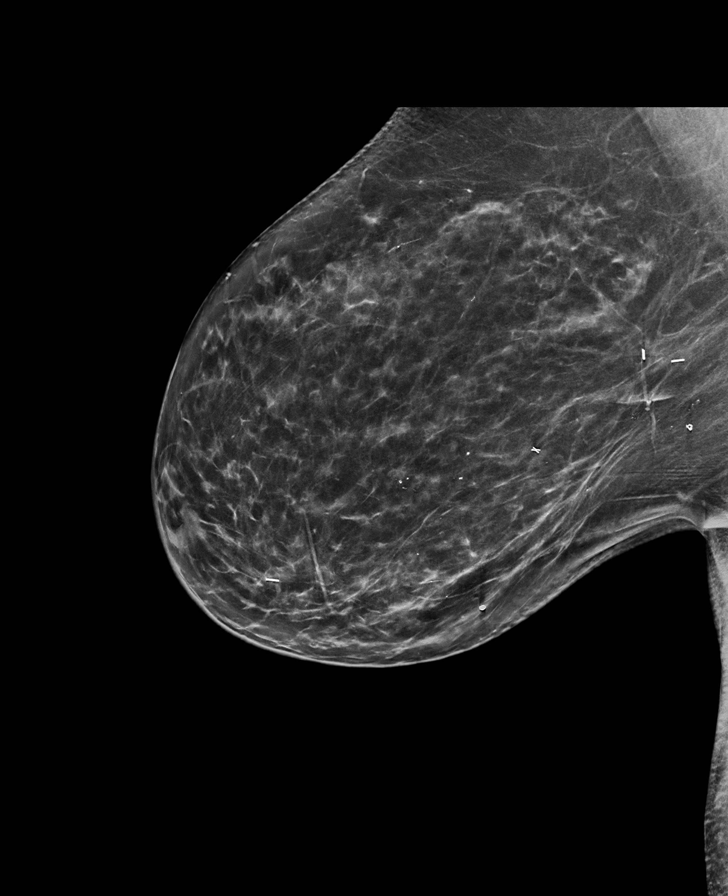

[L CC synth-2D]
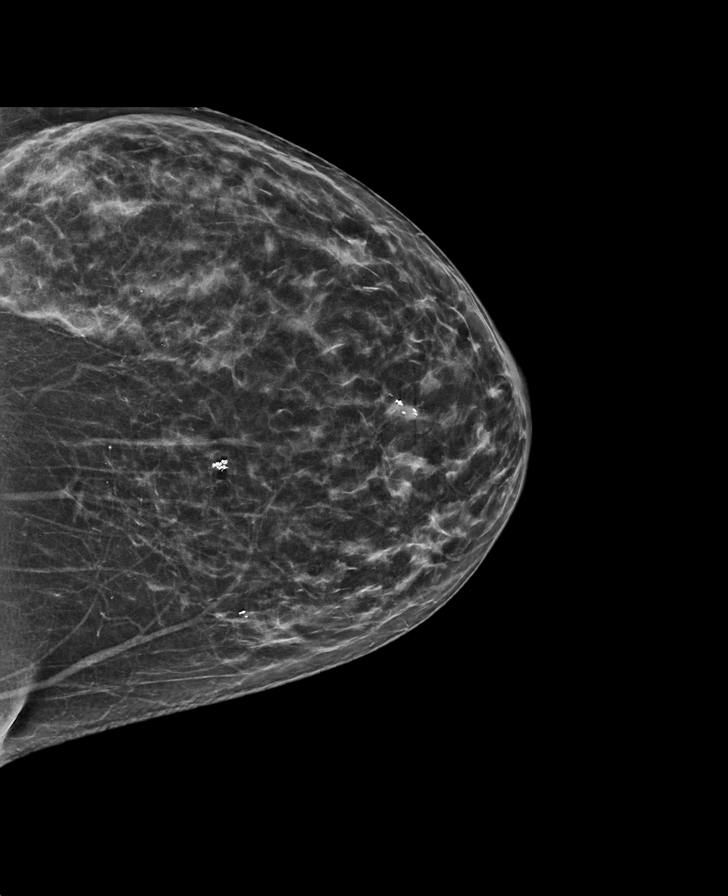

[L MLO synth-2D]
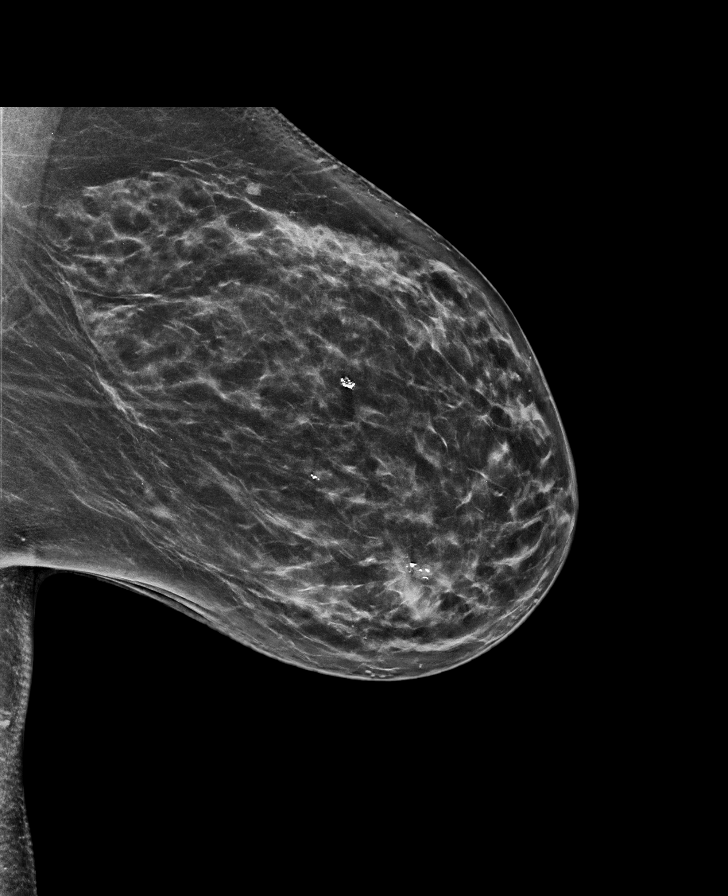

[R CC synth-2D]
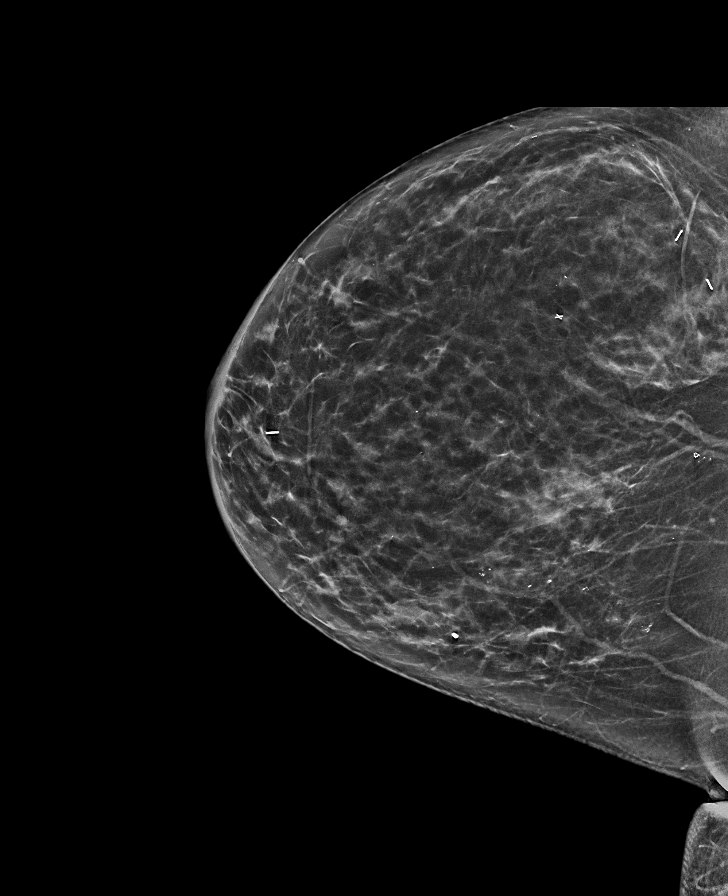

[R CC tomo · tomo slice 35/69.0]
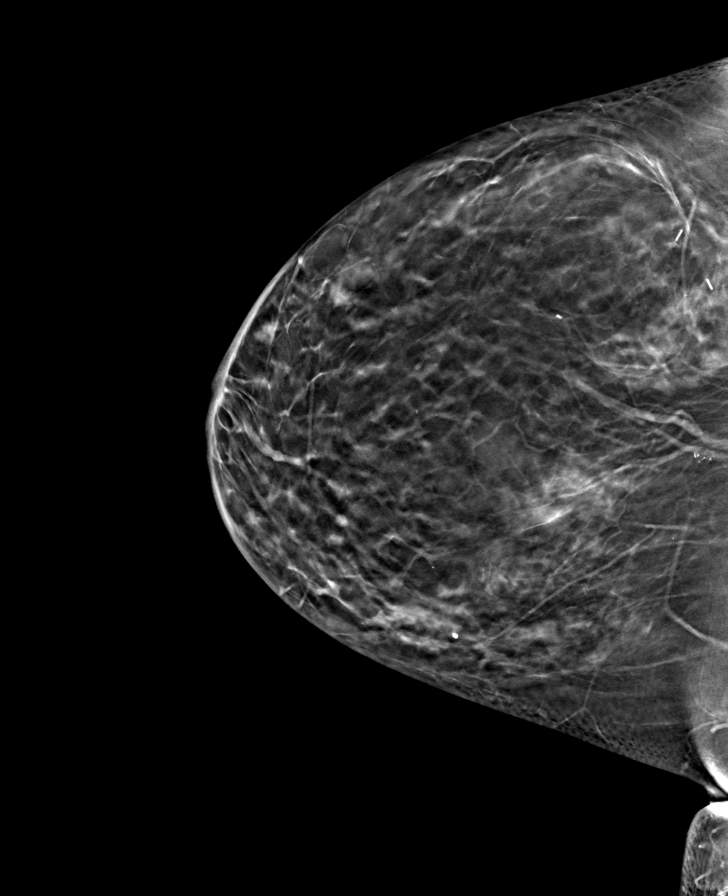

[R MLO tomo · tomo slice 41/81.0]
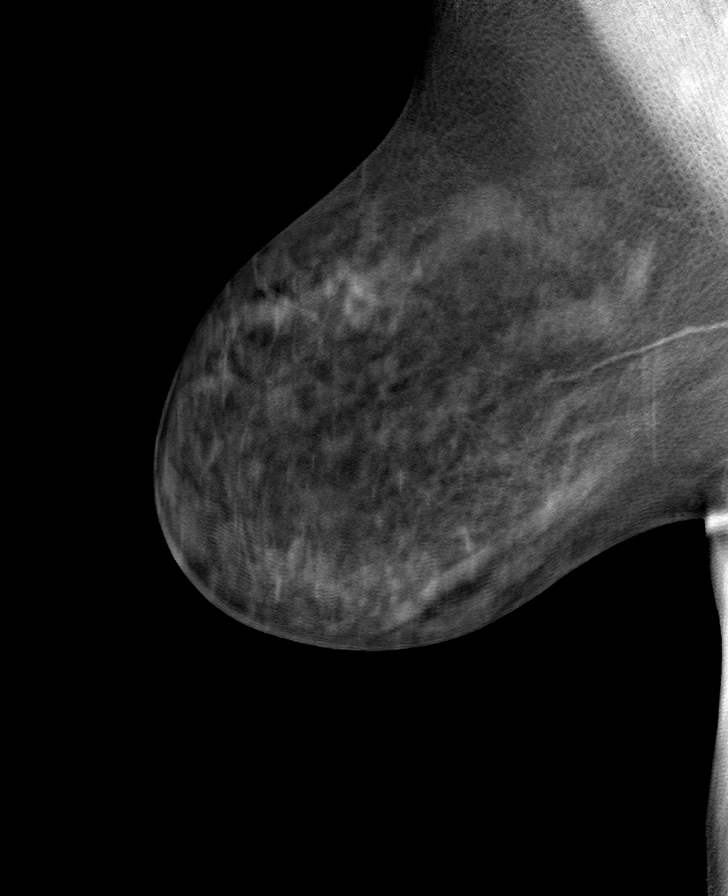

[L CC tomo · tomo slice 35/70.0]
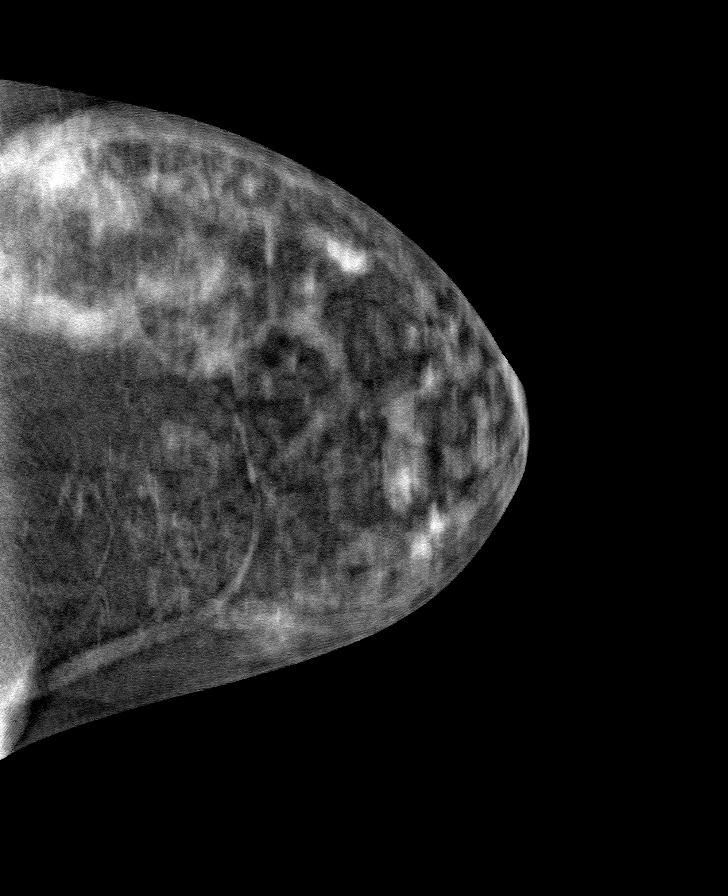

[L MLO tomo · tomo slice 41/82.0]
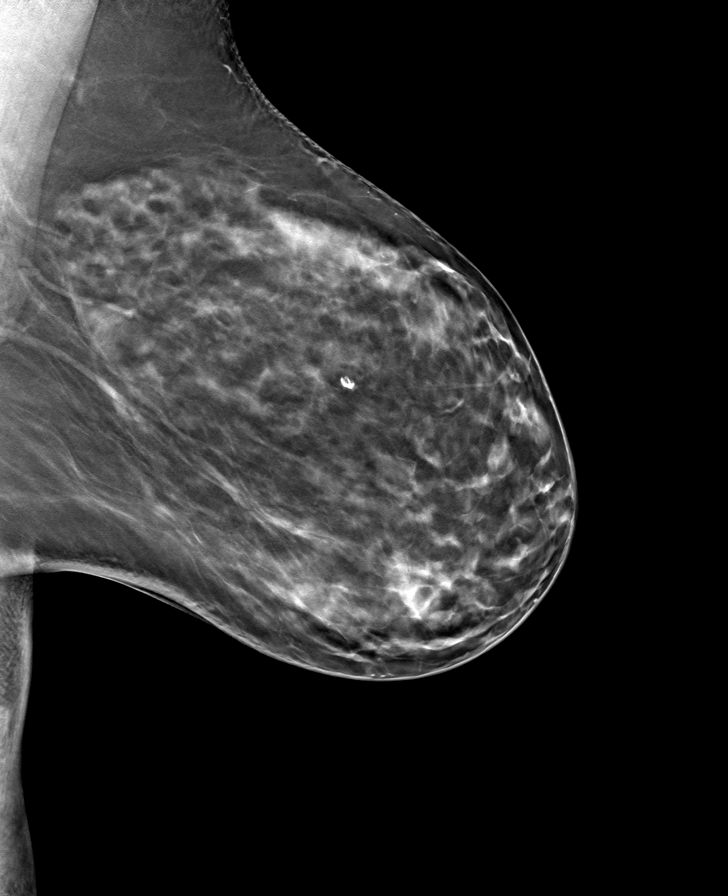

[8 of 24 positions shown; findings below may reference images not displayed]

ACR Breast Density Category c: The breast tissue is heterogeneously
dense, which may obscure small masses.
FINDINGS: There are no findings suspicious for malignancy. Images were
processed with CAD.
IMPRESSION: No mammographic evidence of malignancy. A result letter of this
screening mammogram will be mailed directly to the patient.

RECOMMENDATION:
Screening mammogram in one year. (Code:[id number])

BI-RADS CATEGORY  1: Negative.

## 2019-04-21 ENCOUNTER — Telehealth: Payer: Self-pay | Admitting: Cardiovascular Disease

## 2019-04-21 NOTE — Telephone Encounter (Signed)
LMTCB to schedule appt with Dr. Oval Linsey, see referral.

## 2019-08-09 ENCOUNTER — Telehealth (INDEPENDENT_AMBULATORY_CARE_PROVIDER_SITE_OTHER): Payer: Managed Care, Other (non HMO) | Admitting: Cardiovascular Disease

## 2019-08-09 VITALS — BP 144/95 | HR 110 | Ht 63.0 in | Wt 215.0 lb

## 2019-08-09 DIAGNOSIS — R000 Tachycardia, unspecified: Secondary | ICD-10-CM

## 2019-08-09 DIAGNOSIS — I10 Essential (primary) hypertension: Secondary | ICD-10-CM | POA: Diagnosis not present

## 2019-08-09 NOTE — Patient Instructions (Addendum)
Medication Instructions:  Your physician recommends that you continue on your current medications as directed. Please refer to the Current Medication list given to you today.  If you need a refill on your cardiac medications before your next appointment, please call your pharmacy.   Lab work: NONE   Testing/Procedures: Your physician has recommended that you wear a holter monitor. Holter monitors are medical devices that record the heart's electrical activity. Doctors most often use these monitors to diagnose arrhythmias. Arrhythmias are problems with the speed or rhythm of the heartbeat. The monitor is a small, portable device. You can wear one while you do your normal daily activities. This is usually used to diagnose what is causing palpitations/syncope (passing out). 24 HOUR  Follow-Up: Your physician recommends that you schedule a follow-up appointment in: 1 MONTH ON 09/13/19 AT [place] DNP

## 2019-08-09 NOTE — Progress Notes (Signed)
Virtual Visit via Telephone Note   This visit type was conducted due to national recommendations for restrictions regarding the COVID-19 Pandemic (e.g. social distancing) in an effort to limit this patient's exposure and mitigate transmission in our community.  Due to her co-morbid illnesses, this patient is at least at moderate risk for complications without adequate follow up.  This format is felt to be most appropriate for this patient at this time.  The patient did not have access to video technology/had technical difficulties with video requiring transitioning to audio format only (telephone).  All issues noted in this document were discussed and addressed.  No physical exam could be performed with this format.  Please refer to the patient's chart for her  consent to telehealth for [facility].   Date:  08/09/2019   ID:  Rachel, Nixon 12/08/1956, MRN [id number]  Patient Location: Home Provider Location: Office  PCP:  Darcus Austin, MD (Inactive)  Cardiologist:  No primary care provider on file.  Electrophysiologist:  None   Evaluation Performed:  New Patient Evaluation  Chief Complaint:  tachycardia  History of Present Illness:    Rachel Nixon is a 63 y.o. female with hypertension, hyperlipidemia, atypical ductal hyperplasia, diabetes mellitus, GERD and obesity here to reestablish care.  She was initially seen 07/2016 for the evaluation of an abnormal EKG.  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  At the time she was feeling well and this was thought to be an error.  Rachel Nixon had an echo and Holter 03/2015 for palpitations.  She reports that both were normal.  She continues to have episodes of palpitations at times. They're not very bothersome to her and she usually notices it when laying in bed at night. In the past she was on metoprolol but developed fatigue. She also did not tolerate nebivolol.   She does not use over-the-counter cough or cold medications and typically has 2 caffeinated beverages daily.  She has been working home since March and trying to only run essential errands.  She has been feeling well.  However, she notes that her heart rate has been very erratic.  Her Apple watch shows that it ranges from 52-119 bpm.  She cannot feel the change in her heart rate.  In the past she had some dizzy episodes that lasted for just a few moments.  She denies syncope.  She feels weak when she gets hot.  This occurred yesterday when cooking and also occurs when the weather is hot.  She hasn't noted any palpitations.  She notices that when she lays on her L chest she can feel her heart beating.  [name] hasn't been exercising.  She denies chest pain, shortness of breath, lower extremity edema, orthopnea or PND.     The patient does not have symptoms concerning for COVID-19 infection (fever, chills, cough, or new shortness of breath).    Past Medical History:  Diagnosis Date  . Allergy   . Anxiety   . Arthritis    hips, knees, hands  . Depression   . Diabetes mellitus without complication ([place])   . GERD (gastroesophageal reflux disease)   . Hyperlipidemia   . Hypertension   . 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 Migraine headache   . Obesity    Past Surgical History:  Procedure Laterality Date  . ABDOMINAL HYSTERECTOMY    . BREAST BIOPSY  Right 06/12/2016   Procedure: RIGHT BREAST BIOPSY WITH NEEDLE LOCALIZATION;  Surgeon: Rolm Bookbinder, MD;  Location: [place];  Service: General;  Laterality: Right;  RIGHT BREAST BIOPSY WITH NEEDLE LOCALIZATION  . BREAST BIOPSY Left    No noticable scars anymore   . BREAST EXCISIONAL BIOPSY Right 2017  . BREAST EXCISIONAL BIOPSY Left over 10 years   no visible scar  . CHOLECYSTECTOMY    . FRACTURE SURGERY    . RADIOACTIVE SEED GUIDED EXCISIONAL BREAST BIOPSY Right 06/12/2016   Procedure: RIGHT RADIOACTIVE SEED  GUIDED EXCISIONAL BREAST BIOPSY X 2;  Surgeon: Rolm Bookbinder, MD;  Location: [place];  Service: General;  Laterality: Right;  RIGHT RADIOACTIVE SEED GUIDED EXCISIONAL BREAST BIOPSY X 2  . TONSILLECTOMY       Current Meds  Medication Sig  . amitriptyline (ELAVIL) 10 MG tablet Take 10 mg by mouth at bedtime. TAKE WITH 25 MG TABLETS TO TOTAL 60 MG  . amitriptyline (ELAVIL) 25 MG tablet Take 25 mg by mouth as directed. TAKE 2 AT BEDTIME WITH 10 MG TABLET  . aspirin 81 MG chewable tablet Chew 81 mg by mouth daily.  Marland Kitchen atorvastatin (LIPITOR) 20 MG tablet Take 20 mg by mouth daily.  Marland Kitchen buPROPion (WELLBUTRIN XL) 150 MG 24 hr tablet Take 150 mg by mouth daily.  . Cholecalciferol (VITAMIN D3) 3000 units TABS Take by mouth.  . ergocalciferol (VITAMIN D2) 50000 UNITS capsule Take 50,000 Units by mouth 2 (two) times a week. Sunday and thursday  . glucosamine-chondroitin 500-400 MG tablet Take 1 tablet by mouth 2 (two) times daily.   . Lactobacillus (PROBIOTIC ACIDOPHILUS PO) Take by mouth.  Marland Kitchen lisinopril (PRINIVIL,ZESTRIL) 10 MG tablet Take 10 mg by mouth daily.  . metFORMIN (GLUCOPHAGE) 500 MG tablet Take 1,500 mg by mouth daily with breakfast.   . Multiple Vitamin (MULTIVITAMIN WITH MINERALS) TABS tablet Take 1 tablet by mouth daily.  Marland Kitchen omeprazole (PRILOSEC) 10 MG capsule Take 10 mg by mouth daily.  . raloxifene (EVISTA) 60 MG tablet Take 60 mg by mouth daily.  . Turmeric 500 MG CAPS Take by mouth.     Allergies:   Erythromycin and Lodine [etodolac]   Social History   Tobacco Use  . Smoking status: Never Smoker  Substance Use Topics  . Alcohol use: No  . Drug use: No     Family Hx: The patient's family history includes Alzheimer's disease in her maternal grandfather; Breast cancer in her maternal grandmother; CAD in her father; COPD in an other family member; Cancer in an other family member; Dementia in an other family member; Diabetes in an other family member; Diabetes  type II in her brother; Heart disease in her paternal grandfather and another family member; Hyperlipidemia in an other family member; Hypertension in an other family member; Lymphoma in her paternal grandmother; Stroke in an other family member.  ROS:   Please see the history of present illness.    All other systems reviewed and are negative.   Prior CV studies:   The following studies were reviewed today:  n/a  Labs/Other Tests and Data Reviewed:    EKG:  An ECG dated 07/29/16 was personally reviewed today and demonstrated:  Sinus tachycardia.  Rate 109 bpm.  Recent Labs: 08/31/2018: ALT 30; BUN 16; Creatinine 0.73; Hemoglobin 12.4; Platelet Count 241; Potassium 4.1; Sodium 141   Recent Lipid Panel No results found for: CHOL, TRIG, HDL, CHOLHDL, LDLCALC, LDLDIRECT  Wt Readings from Last  3 Encounters:  08/09/19 215 lb (97.5 kg)  09/11/18 222 lb 4.8 oz (100.8 kg)  12/23/16 220 lb 8 oz (100 kg)     Objective:    Vital Signs:  BP (!) 144/95   Pulse (!) 110   Ht 5\' 3"  (1.6 m)   Wt 215 lb (97.5 kg)   BMI 38.09 kg/m    VITAL SIGNS:  reviewed GEN:  no acute distress RESPIRATORY:  normal respiratory effort, symmetric expansion NEURO:  alert and oriented x 3, no obvious focal deficit PSYCH:  normal affect  ASSESSMENT & PLAN:    #  Variable heart rate:  Rachel Nixon is concerned that her resting heart rate is asymptomatic.  She notices it on her Apple watch and is asymptomatic.  She has a history of tachycardia.  We will get a copy of her old ambulatory monitor and get a 24-hour Holter now.  Laboratory testing has been unremarkable in the past.  She has not tolerated beta-blockers.  Elevated BP: Blood pressure has been elevated.  She thinks in general it is better-controlled.  She will track her blood pressures and bring to follow-up.   COVID-19 Education : The signs and symptoms of COVID-19 were discussed with the patient and how to seek care for testing (follow up with PCP or  arrange E-visit).  The importance of social distancing was discussed today.  Time:   Today, I have spent 23 minutes with the patient with telehealth technology discussing the above problems.     Medication Adjustments/Labs and Tests Ordered: Current medicines are reviewed at length with the patient today.  Concerns regarding medicines are outlined above.   Tests Ordered: Orders Placed This Encounter  Procedures  . Holter monitor - 24 hour     Medication Changes: No orders of the defined types were placed in this encounter.   Follow Up:  Virtual Visit in 1 month(s)    Signed, Skeet Latch, MD  08/09/2019 3:09 PM    [facility]

## 2019-08-10 ENCOUNTER — Telehealth: Payer: Self-pay | Admitting: Radiology

## 2019-08-10 NOTE — Telephone Encounter (Signed)
Enrolled patient for a 3 day Zio monitor to be mailed. Brief instructions were gone over with the patient and she knows to expect the monitor to arrive in 3-4 days. *24hr was switched to a 3 day Zio for mailing purposes during covid. Patient knows she only has to wear for 24 hr per her doctor

## 2019-08-15 ENCOUNTER — Ambulatory Visit (INDEPENDENT_AMBULATORY_CARE_PROVIDER_SITE_OTHER): Payer: Managed Care, Other (non HMO)

## 2019-08-15 DIAGNOSIS — R000 Tachycardia, unspecified: Secondary | ICD-10-CM

## 2019-08-30 ENCOUNTER — Other Ambulatory Visit: Payer: Self-pay

## 2019-09-11 NOTE — Progress Notes (Deleted)
{Choose 1 Note Type (Telehealth Visit or Telephone Visit):[phone]}   Date:  09/11/2019   ID:  Rachel Nixon, DOB [birth year], MRN [id number]  {Patient Location:[id number]::"Home"} {Provider Location:[phone]::"Home"}  PCP:  Darcus Austin, MD (Inactive)  Cardiologist: Dr. Oval Linsey Electrophysiologist:  None   Evaluation Performed:  {Choose Visit [phone]::"Follow-Up Visit"}  Chief Complaint:  ***  History of Present Illness:    Rachel Nixon is a 63 y.o. female we are following for ongoing assessment and management of hyperlipidemia, hypertension, palpitations with other history to include atypical ductal hyperplasia, diabetes mellitus, obesity, and GERD.  She was initially seen by Dr. 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  At the time of the last office visit on 08/09/2019 she had complaints of palpitations.  Dr. Oval Linsey noted that the patient's variable heart rate was concerning to the patient although she was asymptomatic.  She was scheduled for 24-hour Holter monitor.  She is noted to be mildly hypertensive and will monitor her blood pressure at home.  She is to have a trend available to [place] on this visit.  Holter monitor dated 08/30/2019: Patient had a min HR of 72 bpm, max HR of 148 bpm, and avg HR of 98 bpm. Predominant underlying rhythm was Sinus Rhythm. 1 run of Supraventricular Tachycardia occurred lasting 8 beats with a max rate of 148 bpm (avg 128 bpm). Isolated SVEs were rare (<1.0%), SVE Couplets were rare (<1.0%), and SVE Triplets were rare (<1.0%). Isolated VEs were rare (<1.0%), and no VE Couplets or VE Triplets were present  The patient {does/does not:200015} have symptoms concerning for COVID-19 infection (fever, chills, cough, or new shortness of breath).    Past Medical History:  Diagnosis Date   Allergy    Anxiety     Arthritis    hips, knees, hands   Depression    Diabetes mellitus without complication (HCC)    GERD (gastroesophageal reflux disease)    Hyperlipidemia    Hypertension    Inappropriate sinus tachycardia 07/29/2016   Migraine headache    Obesity    Past Surgical History:  Procedure Laterality Date   ABDOMINAL HYSTERECTOMY     BREAST BIOPSY Right 06/12/2016   Procedure: RIGHT BREAST BIOPSY WITH NEEDLE LOCALIZATION;  Surgeon: Rolm Bookbinder, MD;  Location: [place];  Service: General;  Laterality: Right;  RIGHT BREAST BIOPSY WITH NEEDLE LOCALIZATION   BREAST BIOPSY Left    No noticable scars anymore    BREAST EXCISIONAL BIOPSY Right 2017   BREAST EXCISIONAL BIOPSY Left over 10 years   no visible scar   CHOLECYSTECTOMY     FRACTURE SURGERY     RADIOACTIVE SEED GUIDED EXCISIONAL BREAST BIOPSY Right 06/12/2016   Procedure: RIGHT RADIOACTIVE SEED GUIDED EXCISIONAL BREAST BIOPSY X 2;  Surgeon: Rolm Bookbinder, MD;  Location: [place];  Service: General;  Laterality: Right;  RIGHT RADIOACTIVE SEED GUIDED EXCISIONAL BREAST BIOPSY X 2   TONSILLECTOMY       No outpatient medications have been marked as taking for the 09/13/19 encounter (Appointment) with Lendon Colonel, NP.     Allergies:   Erythromycin and Lodine [etodolac]   Social History   Tobacco Use   Smoking status: Never Smoker  Substance Use Topics   Alcohol use: No   Drug use: No     Family Hx: The patient's family history includes  Alzheimer's disease in her maternal grandfather; Breast cancer in her maternal grandmother; CAD in her father; COPD in an other family member; Cancer in an other family member; Dementia in an other family member; Diabetes in an other family member; Diabetes type II in her brother; Heart disease in her paternal grandfather and another family member; Hyperlipidemia in an other family member; Hypertension in an other family member; Lymphoma in  her paternal grandmother; Stroke in an other family member.  ROS:   Please see the history of present illness.    *** All other systems reviewed and are negative.   Prior CV studies:   The following studies were reviewed today:  ***  Labs/Other Tests and Data Reviewed:    EKG:  {EKG/Telemetry Strips Reviewed:[phone]}  Recent Labs: No results found for requested labs within last 8760 hours.   Recent Lipid Panel No results found for: CHOL, TRIG, HDL, CHOLHDL, LDLCALC, LDLDIRECT  Wt Readings from Last 3 Encounters:  08/09/19 215 lb (97.5 kg)  09/11/18 222 lb 4.8 oz (100.8 kg)  12/23/16 220 lb 8 oz (100 kg)     Objective:    Vital Signs:  There were no vitals taken for this visit.   {HeartCare Virtual Exam (Optional):[phone]::"VITAL SIGNS:  reviewed"}  ASSESSMENT & PLAN:    1. ***  COVID-19 Education: The signs and symptoms of COVID-19 were discussed with the patient and how to seek care for testing (follow up with PCP or arrange E-visit).  ***The importance of social distancing was discussed today.  Time:   Today, I have spent *** minutes with the patient with telehealth technology discussing the above problems.     Medication Adjustments/Labs and Tests Ordered: Current medicines are reviewed at length with the patient today.  Concerns regarding medicines are outlined above.   Tests Ordered: No orders of the defined types were placed in this encounter.   Medication Changes: No orders of the defined types were placed in this encounter.   Disposition:  Follow up {follow up:15908}  Signed, Phill Myron. West Pugh, ANP, AACC  09/11/2019 6:38 PM    [facility]

## 2019-09-13 ENCOUNTER — Telehealth: Payer: Managed Care, Other (non HMO) | Admitting: Adult Health

## 2019-09-14 ENCOUNTER — Telehealth: Payer: Self-pay | Admitting: Cardiovascular Disease

## 2019-09-14 NOTE — Telephone Encounter (Signed)
OK.  Lisinopril won't help with the heart rate but it will help the blood pressure.  If she still isn't having any symptoms then we don't have to do anything at all about it because it is not dangerous.  If the irregular heart rate bothers her we can try a different medication like diltiazem.  We can discuss at follow up.  Looks like she was supposed to have one month follow up but I don't see that scheduled.

## 2019-09-14 NOTE — Telephone Encounter (Signed)
Gave pt results. Verbalized understanding. Stated that her PCP once put her on Metoprolol and she did not handle it well. They prescribed Lisinopril. I asked the symptoms and she does not remember. Stated her PCP, Darcus Austin at [employer] at [place] was the one who prescribed who is now retired. Pt is currently on lisinopril. Will route to Dr. Oval Linsey.

## 2019-09-14 NOTE — Progress Notes (Signed)
Lmtcb 09/14/19

## 2019-09-14 NOTE — Telephone Encounter (Signed)
-----   Message from Skeet Latch, MD sent at 09/13/2019  4:19 PM EDT ----- Some short episodes of fast heart rhythms from the top of the heart were noted.  These are not dangerous but can be annoying.  I recommend starting metoprolol tartrate 25 mg twice daily.

## 2019-09-14 NOTE — Telephone Encounter (Signed)
Patient returned call for her holter monitor results.

## 2019-09-14 NOTE — Telephone Encounter (Signed)
Will route to Primary since on follow up today. Thank you!

## 2019-09-15 NOTE — Telephone Encounter (Signed)
Advised patient. Scheduled follow up with Arnold Long DNP later this month. Advised to call if anything changes between now and then, verbalized understanding.

## 2019-09-28 ENCOUNTER — Other Ambulatory Visit: Payer: Self-pay | Admitting: Registered"

## 2019-09-28 DIAGNOSIS — Z20822 Contact with and (suspected) exposure to covid-19: Secondary | ICD-10-CM

## 2019-09-29 LAB — NOVEL CORONAVIRUS, NAA: SARS-CoV-2, NAA: NOT DETECTED

## 2019-10-05 NOTE — Progress Notes (Deleted)
Cardiology Office Note   Date:  10/05/2019   ID:  Rachel Nixon, Rachel Nixon 02-22-1956, MRN [id number]  PCP:  Darcus Austin, MD (Inactive)  Cardiologist: Dr. Oval Linsey No chief complaint on file.    History of Present Illness: Rachel Nixon is a 63 y.o. female who presents for ongoing assessment and management of variable heart rate, hypertension, with other history to include hyperlipidemia, atypical ductal hyperplasia, diabetes mellitus, GERD, and obesity.  The patient was seen last on 08/09/2019 by Dr. Oval Linsey to be reestablished with cardiology.  Rachel Nixon has a history of palpitations and did wear a Holter monitor on 03/2015 which revealed normal sinus rhythm.  She also had an echocardiogram in 2016 which was reportedly normal as well.  She had been on metoprolol in the past but developed fatigue, and also did not tolerate labetalol.  The patient reported to Dr. Oval Linsey on that office visit that her heart rate has been very erratic, with her Apple Watch showing it ranges from 52 to 119 bpm.  The patient has had episodes of weakness when she gets hot.  On that office visit the patient was not treated with any new medications.  A Holter monitor was placed.  She was to keep track of her blood pressures and bring them to follow-up office visit.  Holter monitor dated 08/30/2019 revealed short episodes of fast heart rhythms from the top of the heart were noted, average heart rate 98 bpm, maximum heart rate 148 beats per minutes with a minimum heart rate of 72 bpm with 5 beats of SVT noted.Marland Kitchen  She was started on metoprolol 25 mg daily.   Past Medical History:  Diagnosis Date  . Allergy   . Anxiety   . Arthritis    hips, knees, hands  . Depression   . Diabetes mellitus without complication ([place])   . GERD (gastroesophageal reflux disease)   . Hyperlipidemia   . Hypertension   . 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 Migraine headache   . Obesity     Past Surgical History:  Procedure  Laterality Date  . ABDOMINAL HYSTERECTOMY    . BREAST BIOPSY Right 06/12/2016   Procedure: RIGHT BREAST BIOPSY WITH NEEDLE LOCALIZATION;  Surgeon: Rolm Bookbinder, MD;  Location: [place];  Service: General;  Laterality: Right;  RIGHT BREAST BIOPSY WITH NEEDLE LOCALIZATION  . BREAST BIOPSY Left    No noticable scars anymore   . BREAST EXCISIONAL BIOPSY Right 2017  . BREAST EXCISIONAL BIOPSY Left over 10 years   no visible scar  . CHOLECYSTECTOMY    . FRACTURE SURGERY    . RADIOACTIVE SEED GUIDED EXCISIONAL BREAST BIOPSY Right 06/12/2016   Procedure: RIGHT RADIOACTIVE SEED GUIDED EXCISIONAL BREAST BIOPSY X 2;  Surgeon: Rolm Bookbinder, MD;  Location: [place];  Service: General;  Laterality: Right;  RIGHT RADIOACTIVE SEED GUIDED EXCISIONAL BREAST BIOPSY X 2  . TONSILLECTOMY       Current Outpatient Medications  Medication Sig Dispense Refill  . amitriptyline (ELAVIL) 10 MG tablet Take 10 mg by mouth at bedtime. TAKE WITH 25 MG TABLETS TO TOTAL 60 MG    . amitriptyline (ELAVIL) 25 MG tablet Take 25 mg by mouth as directed. TAKE 2 AT BEDTIME WITH 10 MG TABLET    . aspirin 81 MG chewable tablet Chew 81 mg by mouth daily.    Marland Kitchen atorvastatin (LIPITOR) 20 MG tablet Take 20 mg by mouth daily.    Marland Kitchen buPROPion (WELLBUTRIN XL) 150 MG  24 hr tablet Take 150 mg by mouth daily.    . Cholecalciferol (VITAMIN D3) 3000 units TABS Take by mouth.    . ergocalciferol (VITAMIN D2) 50000 UNITS capsule Take 50,000 Units by mouth 2 (two) times a week. Sunday and thursday    . glucosamine-chondroitin 500-400 MG tablet Take 1 tablet by mouth 2 (two) times daily.     . Lactobacillus (PROBIOTIC ACIDOPHILUS PO) Take by mouth.    Marland Kitchen lisinopril (PRINIVIL,ZESTRIL) 10 MG tablet Take 10 mg by mouth daily.    . metFORMIN (GLUCOPHAGE) 500 MG tablet Take 1,500 mg by mouth daily with breakfast.     . Multiple Vitamin (MULTIVITAMIN WITH MINERALS) TABS tablet Take 1 tablet by mouth daily.    Marland Kitchen  omeprazole (PRILOSEC) 10 MG capsule Take 10 mg by mouth daily.    . raloxifene (EVISTA) 60 MG tablet Take 60 mg by mouth daily.    . Turmeric 500 MG CAPS Take by mouth.     No current facility-administered medications for this visit.     Allergies:   Erythromycin and Lodine [etodolac]    Social History:  The patient  reports that she has never smoked. She does not have any smokeless tobacco history on file. She reports that she does not drink alcohol or use drugs.   Family History:  The patient's family history includes Alzheimer's disease in her maternal grandfather; Breast cancer in her maternal grandmother; CAD in her father; COPD in an other family member; Cancer in an other family member; Dementia in an other family member; Diabetes in an other family member; Diabetes type II in her brother; Heart disease in her paternal grandfather and another family member; Hyperlipidemia in an other family member; Hypertension in an other family member; Lymphoma in her paternal grandmother; Stroke in an other family member.    ROS: All other systems are reviewed and negative. Unless otherwise mentioned in H&P    PHYSICAL EXAM: VS:  There were no vitals taken for this visit. , BMI There is no height or weight on file to calculate BMI. GEN: Well nourished, well developed, in no acute distress HEENT: normal Neck: no JVD, carotid bruits, or masses Cardiac: ***RRR; no murmurs, rubs, or gallops,no edema  Respiratory:  Clear to auscultation bilaterally, normal work of breathing GI: soft, nontender, nondistended, + BS MS: no deformity or atrophy Skin: warm and dry, no rash Neuro:  Strength and sensation are intact Psych: euthymic mood, full affect   EKG:  EKG {ACTION; IS/IS [id number] ordered today. The ekg ordered today demonstrates ***   Recent Labs: No results found for requested labs within last 8760 hours.    Lipid Panel No results found for: CHOL, TRIG, HDL, CHOLHDL, VLDL, LDLCALC,  LDLDIRECT    Wt Readings from Last 3 Encounters:  08/09/19 215 lb (97.5 kg)  09/11/18 222 lb 4.8 oz (100.8 kg)  12/23/16 220 lb 8 oz (100 kg)      Other studies Reviewed: Additional studies/ records that were reviewed today include: ***. Review of the above records demonstrates: ***   ASSESSMENT AND PLAN:  1.  ***   Current medicines are reviewed at length with the patient today.    Labs/ tests ordered today include: *** Phill Myron. West Pugh, ANP, AACC   10/05/2019 1:07 PM    [facility] [place] [address] Office [phone] Fax [phone]  Notice: This dictation was prepared with Dragon dictation along with smaller phrase technology. Any transcriptional errors  that result from this process are unintentional and may not be corrected upon review.

## 2019-10-06 ENCOUNTER — Ambulatory Visit: Payer: Managed Care, Other (non HMO) | Admitting: Adult Health

## 2019-10-11 NOTE — Progress Notes (Signed)
Virtual Visit via Video Note   This visit type was conducted due to national recommendations for restrictions regarding the COVID-19 Pandemic (e.g. social distancing) in an effort to limit this patient's exposure and mitigate transmission in our community.  Due to her co-morbid illnesses, this patient is at least at moderate risk for complications without adequate follow up.  This format is felt to be most appropriate for this patient at this time.  All issues noted in this document were discussed and addressed.  A limited physical exam was performed with this format.  Please refer to the patient's chart for her consent to telehealth for [facility].   Date:  10/11/2019   ID:  Rachel Nixon, Rachel Nixon [birth year], MRN [id number]  Patient Location: Home Provider Location: Home  PCP:  Darcus Austin, MD (Inactive)  Cardiologist:  Dr  Oval Linsey Electrophysiologist:  None   Evaluation Performed:  Follow-Up Visit  Chief Complaint:  Palpitations   History of Present Illness:    Rachel Nixon is a 63 y.o. female we are following for ongoing assessment for hypertension, hyperlipidemia. Other hx of atypical ductal hyperplasia, diabetes mellitus, GERD and obesity here to reestablish care.   She was seen last by Dr.Newhalen on 08/09/2019 with complaints of palpitations. Holter monitor 03/2015 which revealed average heart rate of 98 bpm, maximum heart rate 148 bpm, minimum heart rate of 72 bpm.  There was up to 5 beats of SVT noted.. On that office visit she reported that her HR was erratic, and noted on her Apple Watch reported that HR 52-119 bpm, but she was unable to feel a change in her HR. At that time.  At that time, she was continued on lisinopril 10 mg daily as she was not tolerant of metoprolol for blood pressure control.  She takes it at night.  She states her blood pressures been slightly elevated but she does not take it often.  The patient does not have symptoms concerning for COVID-19  infection (fever, chills, cough, or new shortness of breath).    Past Medical History:  Diagnosis Date   Allergy    Anxiety    Arthritis    hips, knees, hands   Depression    Diabetes mellitus without complication (HCC)    GERD (gastroesophageal reflux disease)    Hyperlipidemia    Hypertension    Inappropriate sinus tachycardia 07/29/2016   Migraine headache    Obesity    Past Surgical History:  Procedure Laterality Date   ABDOMINAL HYSTERECTOMY     BREAST BIOPSY Right 06/12/2016   Procedure: RIGHT BREAST BIOPSY WITH NEEDLE LOCALIZATION;  Surgeon: Rolm Bookbinder, MD;  Location: [place];  Service: General;  Laterality: Right;  RIGHT BREAST BIOPSY WITH NEEDLE LOCALIZATION   BREAST BIOPSY Left    No noticable scars anymore    BREAST EXCISIONAL BIOPSY Right 2017   BREAST EXCISIONAL BIOPSY Left over 10 years   no visible scar   CHOLECYSTECTOMY     FRACTURE SURGERY     RADIOACTIVE SEED GUIDED EXCISIONAL BREAST BIOPSY Right 06/12/2016   Procedure: RIGHT RADIOACTIVE SEED GUIDED EXCISIONAL BREAST BIOPSY X 2;  Surgeon: Rolm Bookbinder, MD;  Location: [place];  Service: General;  Laterality: Right;  RIGHT RADIOACTIVE SEED GUIDED EXCISIONAL BREAST BIOPSY X 2   TONSILLECTOMY       No outpatient medications have been marked as taking for the 10/12/19 encounter (Appointment) with Lendon Colonel, NP.     Allergies:  Erythromycin and Lodine [etodolac]   Social History   Tobacco Use   Smoking status: Never Smoker  Substance Use Topics   Alcohol use: No   Drug use: No     Family Hx: The patient's family history includes Alzheimer's disease in her maternal grandfather; Breast cancer in her maternal grandmother; CAD in her father; COPD in an other family member; Cancer in an other family member; Dementia in an other family member; Diabetes in an other family member; Diabetes type II in her brother; Heart disease in her  paternal grandfather and another family member; Hyperlipidemia in an other family member; Hypertension in an other family member; Lymphoma in her paternal grandmother; Stroke in an other family member.  ROS:   Please see the history of present illness.    All other systems reviewed and are negative.   Prior CV studies:   The following studies were reviewed today: Patient had a min HR of 72 bpm, max HR of 148 bpm, and avg HR of 98 bpm. Predominant underlying rhythm was Sinus Rhythm. 1 run of Supraventricular Tachycardia occurred lasting 8 beats with a max rate of 148 bpm (avg 128 bpm). Isolated SVEs were rare (<1.0%), SVE Couplets were rare (<1.0%), and SVE Triplets were rare (<1.0%). Isolated VEs wererare (<1.0%), and no VE Couplets or VE Triplets were present.  Labs/Other Tests and Data Reviewed:    EKG:  No ECG reviewed.  Recent Labs: No results found for requested labs within last 8760 hours.   Recent Lipid Panel No results found for: CHOL, TRIG, HDL, CHOLHDL, LDLCALC, LDLDIRECT  Wt Readings from Last 3 Encounters:  08/09/19 215 lb (97.5 kg)  09/11/18 222 lb 4.8 oz (100.8 kg)  12/23/16 220 lb 8 oz (100 kg)     Objective:    Vital Signs:  There were no vitals taken for this visit.   VITAL SIGNS:  reviewed GEN:  no acute distress EYES:  sclerae anicteric, EOMI - Extraocular Movements Intact RESPIRATORY:  normal respiratory effort, symmetric expansion NEURO:  alert and oriented x 3, no obvious focal deficit PSYCH:  normal affect  ASSESSMENT & PLAN:    1.  Frequent palpitations: Although she is not aware of them she notices that her apple watch is showing that her heart rate is very erratic and irregular.  Holter monitor did not reveal any significant arrhythmias, atrial fibrillation, but did have 1 short 5 beat run of SVT.  The patient looks at her watch while we are talking today and notices that her heart rate is between 115 and 120 just at rest.  I will stop lisinopril 10  mg, and begin her on diltiazem 120 mg which she will take at at bedtime.  I offered her short acting 60 mg twice daily but she prefers on 1 dose medication as she states that she will forget to take multiple doses.  She is to keep track of her blood pressure and her heart rate and follow-up with [place] in 1 month in person for a EKG and a blood pressure recording.  She is to call us if she has any problems taking the diltiazem.  2.  Hypertension: Blood pressure is elevated today.  The patient states that she is uncertain about her blood pressure trends.  She thinks is been a little elevated.  I have asked her to keep track of her blood pressure on the diltiazem along with her heart rate.  COVID-19 Education: The signs and symptoms of COVID-19 were discussed  with the patient and how to seek care for testing (follow up with PCP or arrange E-visit).  The importance of social distancing was discussed today.  Time:   Today, I have spent 20 minutes with the patient with telehealth technology discussing the above problems.     Medication Adjustments/Labs and Tests Ordered: Current medicines are reviewed at length with the patient today.  Concerns regarding medicines are outlined above.   Tests Ordered: No orders of the defined types were placed in this encounter.   Medication Changes: No orders of the defined types were placed in this encounter.   Disposition:  Follow up one month   Signed, Phill Myron. [place] Pugh, ANP, AACC  10/11/2019 3:22 PM    [facility]

## 2019-10-12 ENCOUNTER — Encounter: Payer: Self-pay | Admitting: Adult Health

## 2019-10-12 ENCOUNTER — Telehealth (INDEPENDENT_AMBULATORY_CARE_PROVIDER_SITE_OTHER): Payer: Managed Care, Other (non HMO) | Admitting: Adult Health

## 2019-10-12 VITALS — BP 147/107 | HR 115 | Ht 63.0 in | Wt 214.0 lb

## 2019-10-12 DIAGNOSIS — I10 Essential (primary) hypertension: Secondary | ICD-10-CM | POA: Diagnosis not present

## 2019-10-12 DIAGNOSIS — R000 Tachycardia, unspecified: Secondary | ICD-10-CM

## 2019-10-12 MED ORDER — DILTIAZEM HCL ER COATED BEADS 120 MG PO CP24: 120.0000 mg | ORAL_CAPSULE | Freq: Every day | ORAL | 3 refills | Status: DC

## 2019-10-12 NOTE — Patient Instructions (Signed)
Medication Instructions:  STOP- Lisinopril START- Diltiazem 120 mg by mouth at bedtime  *If you need a refill on your cardiac medications before your next appointment, please call your pharmacy*  Lab Work: None Ordered  Testing/Procedures: None Ordered  Follow-Up: At [facility], you and your health needs are our priority.  As part of our continuing mission to provide you with exceptional heart care, we have created designated Provider Care Teams.  These Care Teams include your primary Cardiologist (physician) and Advanced Practice Providers (APPs -  Physician Assistants and Nurse Practitioners) who all work together to provide you with the care you need, when you need it.  Your next appointment:   December 7th @ 1:15 pm  The format for your next appointment:   In Person  Provider:   Jory Sims, DNP, ANP

## 2019-10-14 ENCOUNTER — Telehealth: Payer: Self-pay | Admitting: Oncology

## 2019-10-14 NOTE — Telephone Encounter (Signed)
Returned patient's phone call regarding rescheduling 11/18 appointment, per patient's request it has moved to 11/19.

## 2019-10-25 ENCOUNTER — Other Ambulatory Visit: Payer: Self-pay

## 2019-10-25 ENCOUNTER — Other Ambulatory Visit: Payer: Managed Care, Other (non HMO)

## 2019-10-25 ENCOUNTER — Other Ambulatory Visit: Payer: Self-pay | Admitting: *Deleted

## 2019-10-25 ENCOUNTER — Inpatient Hospital Stay: Payer: Managed Care, Other (non HMO) | Attending: Oncology

## 2019-10-25 DIAGNOSIS — E785 Hyperlipidemia, unspecified: Secondary | ICD-10-CM | POA: Insufficient documentation

## 2019-10-25 DIAGNOSIS — Z20822 Contact with and (suspected) exposure to covid-19: Secondary | ICD-10-CM

## 2019-10-25 DIAGNOSIS — N6091 Unspecified benign mammary dysplasia of right breast: Secondary | ICD-10-CM | POA: Insufficient documentation

## 2019-10-25 DIAGNOSIS — Z803 Family history of malignant neoplasm of breast: Secondary | ICD-10-CM | POA: Diagnosis not present

## 2019-10-25 DIAGNOSIS — K219 Gastro-esophageal reflux disease without esophagitis: Secondary | ICD-10-CM | POA: Insufficient documentation

## 2019-10-25 DIAGNOSIS — Z79899 Other long term (current) drug therapy: Secondary | ICD-10-CM | POA: Insufficient documentation

## 2019-10-25 DIAGNOSIS — Z7982 Long term (current) use of aspirin: Secondary | ICD-10-CM | POA: Insufficient documentation

## 2019-10-25 DIAGNOSIS — Z807 Family history of other malignant neoplasms of lymphoid, hematopoietic and related tissues: Secondary | ICD-10-CM | POA: Insufficient documentation

## 2019-10-25 DIAGNOSIS — I10 Essential (primary) hypertension: Secondary | ICD-10-CM | POA: Insufficient documentation

## 2019-10-25 DIAGNOSIS — M1990 Unspecified osteoarthritis, unspecified site: Secondary | ICD-10-CM | POA: Diagnosis not present

## 2019-10-25 DIAGNOSIS — F419 Anxiety disorder, unspecified: Secondary | ICD-10-CM | POA: Diagnosis not present

## 2019-10-25 LAB — COMPREHENSIVE METABOLIC PANEL
ALT: 32 U/L (ref 0–44)
AST: 22 U/L (ref 15–41)
Albumin: 3.3 g/dL — ABNORMAL LOW (ref 3.5–5.0)
Alkaline Phosphatase: 98 U/L (ref 38–126)
Anion gap: 12 (ref 5–15)
BUN: 18 mg/dL (ref 8–23)
CO2: 26 mmol/L (ref 22–32)
Calcium: 8.8 mg/dL — ABNORMAL LOW (ref 8.9–10.3)
Chloride: 103 mmol/L (ref 98–111)
Creatinine, Ser: 0.79 mg/dL (ref 0.44–1.00)
GFR calc Af Amer: >60 mL/min (ref >60)
GFR calc non Af Amer: >60 mL/min (ref >60)
Glucose, Bld: 157 mg/dL — ABNORMAL HIGH (ref 70–99)
Potassium: 4.3 mmol/L (ref 3.5–5.1)
Sodium: 141 mmol/L (ref 135–145)
Total Bilirubin: 0.4 mg/dL (ref 0.3–1.2)
Total Protein: 6.7 g/dL (ref 6.5–8.1)

## 2019-10-25 LAB — CBC WITH DIFFERENTIAL/PLATELET
Abs Immature Granulocytes: 0.03 10*3/uL (ref 0.00–0.07)
Basophils Absolute: 0 10*3/uL (ref 0.0–0.1)
Basophils Relative: 0 %
Eosinophils Absolute: 0.2 10*3/uL (ref 0.0–0.5)
Eosinophils Relative: 2 %
HCT: 40.2 % (ref 36.0–46.0)
Hemoglobin: 12.4 g/dL (ref 12.0–15.0)
Immature Granulocytes: 0 %
Lymphocytes Relative: 19 %
Lymphs Abs: 2 10*3/uL (ref 0.7–4.0)
MCH: 24.9 pg — ABNORMAL LOW (ref 26.0–34.0)
MCHC: 30.8 g/dL (ref 30.0–36.0)
MCV: 80.7 fL (ref 80.0–100.0)
Monocytes Absolute: 0.7 10*3/uL (ref 0.1–1.0)
Monocytes Relative: 6 %
Neutro Abs: 7.7 10*3/uL (ref 1.7–7.7)
Neutrophils Relative %: 73 %
Platelets: 265 10*3/uL (ref 150–400)
RBC: 4.98 MIL/uL (ref 3.87–5.11)
RDW: 14.7 % (ref 11.5–15.5)
WBC: 10.6 10*3/uL — ABNORMAL HIGH (ref 4.0–10.5)
nRBC: 0 % (ref 0.0–0.2)

## 2019-10-27 ENCOUNTER — Ambulatory Visit: Payer: Managed Care, Other (non HMO) | Admitting: Oncology

## 2019-10-27 LAB — NOVEL CORONAVIRUS, NAA: SARS-CoV-2, NAA: NOT DETECTED

## 2019-10-27 NOTE — Progress Notes (Signed)
[place]  [phone] [phone]     ID: Rachel Nixon DOB: 1956/02/17  MR#: [id number]  BWG#:[id number]  Patient Care Team: Maurice Small, MD as PCP - General (Family Medicine) Rolm Bookbinder, MD as Consulting Physician (General Surgery) Magrinat, Virgie Dad, MD as Consulting Physician (Oncology) Clarene Essex, MD as Consulting Physician (Gastroenterology) Skeet Latch, MD as Attending Physician (Cardiology) OTHER MD:  CHIEF COMPLAINT: Atypical ductal hyperplasia  CURRENT TREATMENT:  Raloxifene/Evista   INTERVAL HISTORY: Rachel Nixon returns today for follow-up of her atypical ductal hyperplasia.   She continues on raloxifene, which she tolerates well.  Since her last visit, she underwent bilateral diagnostic mammography with tomography at [facility] on 10/12/2018 showing: breast density category C; no evidence of malignancy in either breast.  She presented to the ED on 11/09/2018 after a significant fall with bruising/trauma to the left breast with persistent firm palpable presumed hematoma over the upper-inner quadrant. She underwent left breast ultrasound on 01/01/2019, which showed: large 6.1 cm hematoma over the 10 o'clock position of the left breast accounting for patient's palpable abnormality.   She tells me that over the last several months the hematoma has continued to shrink so that it is now about one third the size it was originally.  She has met with her surgeon Dr. Donne Hazel and he has reassured her that this requires only monitoring.  She also underwent coronavirus testing on 10/25/2019, which was negative.   REVIEW OF SYSTEMS: Rachel Nixon walks in her neighborhood, not every day and not very long.  She is aware that she needs to increase her exercise program and is planning to get a border [employer] which she is sure is I am will encourage her.  She is taking appropriate precautions regarding the current pandemic and  unfortunately will not be able to be with family these holidays.  Aside from these issues a detailed review of systems today was stable   BREAST CANCER HISTORY: From the original intake note:  Rachel Nixon had routine bilateral screening mammography at [facility] 07/04/2015 showing an area of possible distortion in the right breast. On 07/10/2015 she underwent right diagnostic mammography with tomosynthesis and ultrasonography. The breast density was category C. This confirmed an area of architectural distortion in the lower outer quadrant of the right breast associated with coarse and punctate calcifications more medially. Ultrasonography was negative. Biopsy of the area of distortion in the right breast 07/18/2015 showed ([id number]) no atypia or malignancy.  Six-month follow-up mammography was recommended, and performed 02/22/2016. The area of distortion in the lower outer right breast was unchanged. There was an additional area of distortion more posterior which was also felt to be mammographically stable. The 4 mm group of likely dystrophic calcifications also appear unchanged. Physical exam was benign. Whenever targeted ultrasound of the right breast showed a dilated duct in the subareolar right breast at the 6:00 position, measuring 0.9 cm. There was no evidence of lymphadenopathy.  Because of the questionable findings, bilateral MRI was recommended and performed 04/12/2016. This showed in the lower outer right breast at 1.0 cm area of distortion which had been biopsied in August 2016. There was abnormal intraductal enhancement measuring 1.5 cm in the right subareolar region. The left breast was unremarkable and there was no adenopathy of concern.  On 05/01/2016, she underwent MRI guided biopsy of the lower outer quadrant area in question and this showed a complex sclerosing lesion ([id number]). The patient was then referred to surgery  and after appropriate discussion underwent biopsy of the 3  areas in question in the right breast 06/12/2016. 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 The margins were clear. Patient is being referred for discussion of breast cancer high-risk management   Her subsequent history is as detailed below    PAST MEDICAL HISTORY: Past Medical History:  Diagnosis Date  . Allergy   . Anxiety   . Arthritis    hips, knees, hands  . Depression   . Diabetes mellitus without complication ([place])   . GERD (gastroesophageal reflux disease)   . Hyperlipidemia   . Hypertension   . 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 Migraine headache   . Obesity     PAST SURGICAL HISTORY: Past Surgical History:  Procedure Laterality Date  . ABDOMINAL HYSTERECTOMY    . BREAST BIOPSY Right 06/12/2016   Procedure: RIGHT BREAST BIOPSY WITH NEEDLE LOCALIZATION;  Surgeon: Rolm Bookbinder, MD;  Location: [place];  Service: General;  Laterality: Right;  RIGHT BREAST BIOPSY WITH NEEDLE LOCALIZATION  . BREAST BIOPSY Left    No noticable scars anymore   . BREAST EXCISIONAL BIOPSY Right 2017  . BREAST EXCISIONAL BIOPSY Left over 10 years   no visible scar  . CHOLECYSTECTOMY    . FRACTURE SURGERY    . RADIOACTIVE SEED GUIDED EXCISIONAL BREAST BIOPSY Right 06/12/2016   Procedure: RIGHT RADIOACTIVE SEED GUIDED EXCISIONAL BREAST BIOPSY X 2;  Surgeon: Rolm Bookbinder, MD;  Location: [place];  Service: General;  Laterality: Right;  RIGHT RADIOACTIVE SEED GUIDED EXCISIONAL BREAST BIOPSY X 2  . TONSILLECTOMY      FAMILY HISTORY Family History  Problem Relation Age of Onset  . CAD Father   . Diabetes type II Brother   . Cancer Other   . Hypertension Other   . COPD Other   . Hyperlipidemia Other   . Diabetes Other   . Stroke Other   . Heart disease Other   . Dementia Other   . Breast cancer Maternal Grandmother   . Alzheimer's disease  Maternal Grandfather   . Lymphoma Paternal Grandmother   . Heart disease Paternal Grandfather   The patient's father died at age 39 due to cardiac arrest. The patient's mother died at age 63 due to [place] (she died before an official diagnosis). The patient has one brother, no sisters. The paternal grandmother had breast cancer at a late age. A maternal grandmother had non-Hodgkin's lymphoma.   GYNECOLOGIC HISTORY:  No LMP recorded. Patient has had a hysterectomy. Menarche age 33, first live birth age 39, the patient is GX P1. She underwent simple hysterectomy without salpingo-oophorectomy in 2001 and took hormone replacement approximately 7 years. She had no complications   SOCIAL HISTORY: Updated 09/11/2018 Rachel Nixon works as a Herbalist. She is divorced and lives by herself, with no pets. Son Harrell Gave lives in [place] and works as a Government social research officer for [employer]. Gerald Stabs' wife is a Therapist, sports for the same company. The patient has 2 grandchildren, one of whom has a rare genetics disorder. The patient attends the [employer]: Her son is her healthcare power of attorney. He can be reached at [phone]   HEALTH MAINTENANCE: Social History   Tobacco Use  . Smoking status: Never Smoker  . Smokeless tobacco: Never Used  Substance Use Topics  . Alcohol use: No  . Drug  use: No     Colonoscopy:  PAP:  Bone density:   Allergies  Allergen Reactions  . Erythromycin Nausea Only  . Lodine [Etodolac] Hives    Current Outpatient Medications  Medication Sig Dispense Refill  . amitriptyline (ELAVIL) 50 MG tablet Take 50-100 mg by mouth at bedtime.    Marland Kitchen aspirin 81 MG chewable tablet Chew 81 mg by mouth daily.    Marland Kitchen atorvastatin (LIPITOR) 20 MG tablet Take 20 mg by mouth daily.    Marland Kitchen buPROPion (WELLBUTRIN XL) 300 MG 24 hr tablet TAKE 1 TABLET BY MOUTH EVERY DAY IN THE MORNING    . Cholecalciferol (VITAMIN D3) 3000 units TABS Take by  mouth.    . diltiazem (CARDIZEM CD) 120 MG 24 hr capsule Take 1 capsule (120 mg total) by mouth at bedtime. 90 capsule 3  . ergocalciferol (VITAMIN D2) 50000 UNITS capsule Take 50,000 Units by mouth 2 (two) times a week. Sunday and thursday    . glucosamine-chondroitin 500-400 MG tablet Take 1 tablet by mouth 2 (two) times daily.     . Lactobacillus (PROBIOTIC ACIDOPHILUS PO) Take by mouth.    . metFORMIN (GLUCOPHAGE) 500 MG tablet Take 1,500 mg by mouth daily with breakfast.     . Multiple Vitamin (MULTIVITAMIN WITH MINERALS) TABS tablet Take 1 tablet by mouth daily.    Marland Kitchen omeprazole (PRILOSEC) 10 MG capsule Take 10 mg by mouth daily.    . raloxifene (EVISTA) 60 MG tablet Take 60 mg by mouth daily.    . Turmeric 500 MG CAPS Take by mouth.     No current facility-administered medications for this visit.     OBJECTIVE: Middle-aged white woman who appears stated age 17:   10/28/19 1458  BP: (!) 132/95  Pulse: (!) 107  Resp: 18  Temp: 98.2 F (36.8 C)  SpO2: 99%     Body mass index is 38.88 kg/m.    ECOG FS:0 - Asymptomatic  Sclerae unicteric, EOMs intact Wearing a mask No cervical or supraclavicular adenopathy Lungs no rales or rhonchi Heart regular rate and rhythm Abd soft, nontender, positive bowel sounds MSK no focal spinal tenderness, no upper extremity lymphedema Neuro: nonfocal, well oriented, appropriate affect Breasts: The right breast is status post lumpectomy.  There is no finding of concern.  The left breast is benign.  The hematoma or seroma is palpable in the lateral aspect of the breast, measuring less than 2 cm, firm, nontender, with no erythema.  Both axillae are benign.    LAB RESULTS:  CMP     Component Value Date/Time   NA 141 10/25/2019 0942   NA 138 07/24/2016 1615   K 4.3 10/25/2019 0942   K 4.3 07/24/2016 1615   CL 103 10/25/2019 0942   CO2 26 10/25/2019 0942   CO2 25 07/24/2016 1615   GLUCOSE 157 (H) 10/25/2019 0942   GLUCOSE 105 07/24/2016  1615   BUN 18 10/25/2019 0942   BUN 14.5 07/24/2016 1615   CREATININE 0.79 10/25/2019 0942   CREATININE 0.73 08/31/2018 1249   CREATININE 0.9 07/24/2016 1615   CALCIUM 8.8 (L) 10/25/2019 0942   CALCIUM 9.9 07/24/2016 1615   PROT 6.7 10/25/2019 0942   PROT 7.4 07/24/2016 1615   ALBUMIN 3.3 (L) 10/25/2019 0942   ALBUMIN 3.7 07/24/2016 1615   AST 22 10/25/2019 0942   AST 25 08/31/2018 1249   AST 31 07/24/2016 1615   ALT 32 10/25/2019 0942   ALT 30 08/31/2018 1249   ALT  35 07/24/2016 1615   ALKPHOS 98 10/25/2019 0942   ALKPHOS 131 07/24/2016 1615   BILITOT 0.4 10/25/2019 0942   BILITOT <0.2 (L) 08/31/2018 1249   BILITOT 0.36 07/24/2016 1615   GFRNONAA >60 10/25/2019 0942   GFRNONAA >60 08/31/2018 1249   GFRAA >60 10/25/2019 0942   GFRAA >60 08/31/2018 1249    INo results found for: SPEP, UPEP  Lab Results  Component Value Date   WBC 10.6 (H) 10/25/2019   NEUTROABS 7.7 10/25/2019   HGB 12.4 10/25/2019   HCT 40.2 10/25/2019   MCV 80.7 10/25/2019   PLT 265 10/25/2019      Chemistry      Component Value Date/Time   NA 141 10/25/2019 0942   NA 138 07/24/2016 1615   K 4.3 10/25/2019 0942   K 4.3 07/24/2016 1615   CL 103 10/25/2019 0942   CO2 26 10/25/2019 0942   CO2 25 07/24/2016 1615   BUN 18 10/25/2019 0942   BUN 14.5 07/24/2016 1615   CREATININE 0.79 10/25/2019 0942   CREATININE 0.73 08/31/2018 1249   CREATININE 0.9 07/24/2016 1615      Component Value Date/Time   CALCIUM 8.8 (L) 10/25/2019 0942   CALCIUM 9.9 07/24/2016 1615   ALKPHOS 98 10/25/2019 0942   ALKPHOS 131 07/24/2016 1615   AST 22 10/25/2019 0942   AST 25 08/31/2018 1249   AST 31 07/24/2016 1615   ALT 32 10/25/2019 0942   ALT 30 08/31/2018 1249   ALT 35 07/24/2016 1615   BILITOT 0.4 10/25/2019 0942   BILITOT <0.2 (L) 08/31/2018 1249   BILITOT 0.36 07/24/2016 1615       No results found for: LABCA2  No components found for: LABCA125  No results for input(s): INR in the last 168  hours.  Urinalysis No results found for: COLORURINE, APPEARANCEUR, LABSPEC, PHURINE, GLUCOSEU, HGBUR, BILIRUBINUR, KETONESUR, PROTEINUR, UROBILINOGEN, NITRITE, LEUKOCYTESUR   STUDIES: No results found.   ELIGIBLE FOR AVAILABLE RESEARCH PROTOCOL: no  ASSESSMENT: 63 y.o. [place] woman status post right breast biopsy 06/12/2016 for atypical ductal hyperplasia in the setting of a radial scar  (1) breast density category C  (2) started raloxifene for risk reduction 07/24/2016  (3) left breast seroma secondary to trauma from a fall December 2019  PLAN: Rachel Nixon is now a little over 3 years into her 5 planned years of raloxifene.  She tolerates this well.  She understands that the seroma or hematoma in her left breast is not cancer associated but remains anxious about this.  She would like an ultrasound of the breast together with her next set of mammograms and I am glad to obtain that for her.  We discussed her elevated glucose and we discussed diet and exercise issues in detail today.  She is keeping appropriate pandemic precautions.  Otherwise she will see me again in 1 year.  She knows to call for any other issue that may develop before that time.  Magrinat, Virgie Dad, MD  10/29/19 10:43 AM Medical Oncology and Hematology [facility] [address], [place] [address] Tel. [phone]    Fax. [phone]   I, Wilburn Mylar, am acting as scribe for Dr. Virgie Dad. Magrinat.  I, Lurline Del MD, have reviewed the above documentation for accuracy and completeness, and I agree with the above.

## 2019-10-28 ENCOUNTER — Inpatient Hospital Stay: Payer: Managed Care, Other (non HMO) | Admitting: Oncology

## 2019-10-28 ENCOUNTER — Other Ambulatory Visit: Payer: Self-pay

## 2019-10-28 VITALS — BP 132/95 | HR 107 | Temp 98.2°F | Resp 18 | Ht 63.0 in | Wt 219.5 lb

## 2019-10-28 DIAGNOSIS — N6091 Unspecified benign mammary dysplasia of right breast: Secondary | ICD-10-CM | POA: Diagnosis not present

## 2019-10-28 DIAGNOSIS — I10 Essential (primary) hypertension: Secondary | ICD-10-CM

## 2019-10-29 ENCOUNTER — Telehealth: Payer: Self-pay | Admitting: Oncology

## 2019-10-29 NOTE — Telephone Encounter (Signed)
Scheduled per los called and left msg mailed printout  

## 2019-11-10 NOTE — Progress Notes (Signed)
Cardiology Office Note   Date:  11/15/2019   ID:  Riza, Shillings 03-07-1956, MRN [id number]  PCP:  Maurice Small, MD  Cardiologist:  Dr.Arabi  CC: Follow Up   History of Present Illness: Rachel Nixon is a 63 y.o. female who presents for ongoing assessment for hypertension, hyperlipidemia. Other hx of atypical ductal hyperplasia, diabetes mellitus, GERD and obesityshe is also being treated by Dr. Jana Hakim for atypical ductal hyperplasia.  There was no evidence of malignancy in either breast per mammogram on 10/12/2018.  She was evaluated last by televisit on 10/12/2019 by me.  Lisinopril was discontinued and she was started on diltiazem 120 mg daily which she was to take at bedtime.  This was to help with blood pressure and frequent palpitations.  She stated that her heart rate runs between 115 and 120 bpm while at rest.  She was offered short acting 60 mg twice daily but preferred to 1 dose medication as she stated that she would forget to take multiple doses.  She was to take her blood pressure and record it daily.  She comes today with continued anxiety.  Blood pressures have been better at home according to her recordings, ranging from 147/103 124/68.  Her heart rate remains elevated between 98 and 120.  She has not felt her heart racing or feeling extra palpitations but she feels fatigued a lot.  She is in a high stress job and is also admitted that she has had a good bit of family stress over the last couple years with deaths of family members most recently her brother in February 2020.  She states that she thinks that some of this is related to her overall status as she has not had time to grieve these losses.  She is considering psychotherapy or grief counseling.  Past Medical History:  Diagnosis Date  . Allergy   . Anxiety   . Arthritis    hips, knees, hands  . Depression   . Diabetes mellitus without complication ([place])   . GERD (gastroesophageal reflux disease)   .  Hyperlipidemia   . Hypertension   . 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 Migraine headache   . Obesity     Past Surgical History:  Procedure Laterality Date  . ABDOMINAL HYSTERECTOMY    . BREAST BIOPSY Right 06/12/2016   Procedure: RIGHT BREAST BIOPSY WITH NEEDLE LOCALIZATION;  Surgeon: Rolm Bookbinder, MD;  Location: [place];  Service: General;  Laterality: Right;  RIGHT BREAST BIOPSY WITH NEEDLE LOCALIZATION  . BREAST BIOPSY Left    No noticable scars anymore   . BREAST EXCISIONAL BIOPSY Right 2017  . BREAST EXCISIONAL BIOPSY Left over 10 years   no visible scar  . CHOLECYSTECTOMY    . FRACTURE SURGERY    . RADIOACTIVE SEED GUIDED EXCISIONAL BREAST BIOPSY Right 06/12/2016   Procedure: RIGHT RADIOACTIVE SEED GUIDED EXCISIONAL BREAST BIOPSY X 2;  Surgeon: Rolm Bookbinder, MD;  Location: [place];  Service: General;  Laterality: Right;  RIGHT RADIOACTIVE SEED GUIDED EXCISIONAL BREAST BIOPSY X 2  . TONSILLECTOMY       Current Outpatient Medications  Medication Sig Dispense Refill  . amitriptyline (ELAVIL) 50 MG tablet Take 50-100 mg by mouth at bedtime.    Marland Kitchen aspirin 81 MG chewable tablet Chew 81 mg by mouth daily.    Marland Kitchen atorvastatin (LIPITOR) 20 MG tablet Take 20 mg by mouth daily.    Marland Kitchen buPROPion (WELLBUTRIN XL) 300 MG 24 hr  tablet TAKE 1 TABLET BY MOUTH EVERY DAY IN THE MORNING    . Cholecalciferol (VITAMIN D3) 3000 units TABS Take by mouth.    . diltiazem (CARDIZEM CD) 120 MG 24 hr capsule Take 1 capsule (120 mg total) by mouth at bedtime. 90 capsule 3  . ergocalciferol (VITAMIN D2) 50000 UNITS capsule Take 50,000 Units by mouth 2 (two) times a week. Sunday and thursday    . glucosamine-chondroitin 500-400 MG tablet Take 1 tablet by mouth 2 (two) times daily.     . Lactobacillus (PROBIOTIC ACIDOPHILUS PO) Take by mouth.    . metFORMIN (GLUCOPHAGE) 500 MG tablet Take 1,500 mg by mouth daily with breakfast.     . Multiple Vitamin  (MULTIVITAMIN WITH MINERALS) TABS tablet Take 1 tablet by mouth daily.    Marland Kitchen omeprazole (PRILOSEC) 10 MG capsule Take 10 mg by mouth daily.    . raloxifene (EVISTA) 60 MG tablet Take 60 mg by mouth daily.    . Turmeric 500 MG CAPS Take by mouth.    . propranolol (INDERAL) 20 MG tablet Take 1 tablet (20 mg total) by mouth 2 (two) times daily. 60 tablet 3   No current facility-administered medications for this visit.     Allergies:   Erythromycin, Lodine [etodolac], and Metoprolol    Social History:  The patient  reports that she has never smoked. She has never used smokeless tobacco. She reports that she does not drink alcohol or use drugs.   Family History:  The patient's family history includes Alzheimer's disease in her maternal grandfather; Breast cancer in her maternal grandmother; CAD in her father; COPD in an other family member; Cancer in an other family member; Dementia in an other family member; Diabetes in an other family member; Diabetes type II in her brother; Heart disease in her paternal grandfather and another family member; Hyperlipidemia in an other family member; Hypertension in an other family member; Lymphoma in her paternal grandmother; Stroke in an other family member.    ROS: All other systems are reviewed and negative. Unless otherwise mentioned in H&P    PHYSICAL EXAM: VS:  BP 122/82   Pulse (!) 111   Temp (!) 96.7 F (35.9 C)   Ht 5\' 3"  (1.6 m)   Wt 218 lb (98.9 kg)   SpO2 95%   BMI 38.62 kg/m  , BMI Body mass index is 38.62 kg/m. GEN: Well nourished, well developed, in no acute distress HEENT: normal Neck: no JVD, carotid bruits, or masses Cardiac: RRR, tachycardic; no murmurs, rubs, or gallops,no edema  Respiratory:  Clear to auscultation bilaterally, normal work of breathing GI: soft, nontender, nondistended, + BS MS: no deformity or atrophy, scoliosis is noted with kyphosis Skin: warm and dry, no rash Neuro:  Strength and sensation are intact  Psych: euthymic mood, full affect   EKG: Heart rate 111 bpm, some motion artifact is noted.  Recent Labs: 10/25/2019: ALT 32; BUN 18; Creatinine, Ser 0.79; Hemoglobin 12.4; Platelets 265; Potassium 4.3; Sodium 141    Lipid Panel No results found for: CHOL, TRIG, HDL, CHOLHDL, VLDL, LDLCALC, LDLDIRECT    Wt Readings from Last 3 Encounters:  11/15/19 218 lb (98.9 kg)  10/28/19 219 lb 8 oz (99.6 kg)  10/12/19 214 lb (97.1 kg)      Other studies Reviewed: Cardiac Monitor Patient had a min HR of 72 bpm, max HR of 148 bpm, and avg HR of 98 bpm. Predominant underlying rhythm was Sinus Rhythm. 1 run of Supraventricular  Tachycardia occurred lasting 8 beats with a max rate of 148 bpm (avg 128 bpm). Isolated SVEs were rare (<1.0%), SVE Couplets were rare (<1.0%), and SVE Triplets were rare (<1.0%). Isolated VEs wererare (<1.0%), and no VE Couplets or VE Triplets were present.  ASSESSMENT AND PLAN:  1.  Inappropriate tachycardia: Heart rate is between 98 and 120 at rest.  Cardiac monitor revealed that her average heart rate was 98 bpm.  She has complaints of generalized fatigue.  I am going to start her on low-dose propanolol 20 mg twice daily.  She will come back in a month to have an EKG and to evaluate her heart rate control.She has had her TSH checked by PCP and she reports that it was normal  2.  Hypertension: She has had labile blood pressure.  On average is been in the [id number] systolic.  We will see how she responds to the propanolol concerning her blood pressure control.  May have to back off on diltiazem to 90 mg daily.  We will see how she does with new medication.  She is to call us if she does not tolerate propanolol.  3.  Situational anxiety: She expresses that she is under a high stress job as well and is having a lot of loss in her family over the last year including her brother who committed suicide in February 2020.  She is still not grieved his loss.  She is considering grief  counseling or psychiatric counseling to help her.  I have encouraged her to do so as this may be helpful in the short-term.  She already has a Social worker in mind and does not need a referral.  4.GERD: Continues on PPI.  No complaints of symptoms at this time.   Current medicines are reviewed at length with the patient today.    Labs/ tests ordered today include: None  Phill Myron. West Pugh, ANP, AACC   11/15/2019 2:22 PM    [facility] [place] [address] Office [phone] Fax [phone]  Notice: This dictation was prepared with Dragon dictation along with smaller phrase technology. Any transcriptional errors that result from this process are unintentional and may not be corrected upon review.

## 2019-11-15 ENCOUNTER — Ambulatory Visit: Payer: Managed Care, Other (non HMO) | Admitting: Adult Health

## 2019-11-15 ENCOUNTER — Other Ambulatory Visit: Payer: Self-pay

## 2019-11-15 ENCOUNTER — Encounter: Payer: Self-pay | Admitting: Adult Health

## 2019-11-15 VITALS — BP 122/82 | HR 111 | Temp 96.7°F | Ht 63.0 in | Wt 218.0 lb

## 2019-11-15 DIAGNOSIS — R000 Tachycardia, unspecified: Secondary | ICD-10-CM | POA: Diagnosis not present

## 2019-11-15 DIAGNOSIS — F418 Other specified anxiety disorders: Secondary | ICD-10-CM | POA: Diagnosis not present

## 2019-11-15 DIAGNOSIS — I10 Essential (primary) hypertension: Secondary | ICD-10-CM

## 2019-11-15 MED ORDER — PROPRANOLOL HCL 20 MG PO TABS: 20.0000 mg | ORAL_TABLET | Freq: Two times a day (BID) | ORAL | 3 refills | Status: DC

## 2019-11-15 NOTE — Patient Instructions (Signed)
Medication Instructions:  START- Propranolol 20 mg by mouth twice a day  *If you need a refill on your cardiac medications before your next appointment, please call your pharmacy*  Lab Work: None Ordered If you have labs (blood work) drawn today and your tests are completely normal, you will receive your results only by: Marland Kitchen MyChart Message (if you have MyChart) OR . A paper copy in the mail If you have any lab test that is abnormal or we need to change your treatment, we will call you to review the results.  Testing/Procedures: None Ordered  Follow-Up: At [facility], you and your health needs are our priority.  As part of our continuing mission to provide you with exceptional heart care, we have created designated Provider Care Teams.  These Care Teams include your primary Cardiologist (physician) and Advanced Practice Providers (APPs -  Physician Assistants and Nurse Practitioners) who all work together to provide you with the care you need, when you need it.  Your next appointment:   1 month(s)  The format for your next appointment:   In Person  Provider:   Jory Sims, DNP, ANP  Other Instructions

## 2019-11-19 ENCOUNTER — Other Ambulatory Visit: Payer: Managed Care, Other (non HMO)

## 2019-11-23 ENCOUNTER — Other Ambulatory Visit: Payer: Managed Care, Other (non HMO)

## 2019-11-26 ENCOUNTER — Other Ambulatory Visit: Payer: Self-pay | Admitting: Oncology

## 2019-12-20 ENCOUNTER — Ambulatory Visit: Payer: Managed Care, Other (non HMO) | Admitting: Adult Health

## 2019-12-25 NOTE — Progress Notes (Deleted)
Cardiology Office Note   Date:  12/25/2019   ID:  Rachel, Nixon 06-Aug-1956, MRN [id number]  PCP:  Maurice Small, MD  Cardiologist:  Dr. Oval Linsey  No chief complaint on file.    History of Present Illness: Rachel Nixon is a 64 y.o. female who presents for for ongoing assessment forhypertension, hyperlipidemia. Other hx ofatypical ductal hyperplasia, diabetes mellitus, GERD and obesityshe is also being treated by Dr. Jana Hakim for atypical ductal hyperplasia.  There was no evidence of malignancy in either breast per mammogram on 10/12/2018.  Was last seen in the office on 11/15/2019 with complaints of continued anxiety which have contributed to hypertension and tachycardia.  She was complaining of fatigue.  She admits to being in a high stress job and under significant family stress.  At that office visit I treated her for inappropriate tachycardia, and begin her on low-dose propanolol 20 mg twice daily.  She is to have an EKG today to evaluate her response to medication.  She is also to keep a record of her blood pressure to ascertain whether or not we need to adjust her dose of diltiazem to prevent hypotension with addition of propanolol.    She was also encouraged to keep her appointment with a psychotherapist which she had already sought out to help her with her anxiety and psychological stress.   Past Medical History:  Diagnosis Date  . Allergy   . Anxiety   . Arthritis    hips, knees, hands  . Depression   . Diabetes mellitus without complication ([place])   . GERD (gastroesophageal reflux disease)   . Hyperlipidemia   . Hypertension   . 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 Migraine headache   . Obesity     Past Surgical History:  Procedure Laterality Date  . ABDOMINAL HYSTERECTOMY    . BREAST BIOPSY Right 06/12/2016   Procedure: RIGHT BREAST BIOPSY WITH NEEDLE LOCALIZATION;  Surgeon: Rolm Bookbinder, MD;  Location: [place];  Service: General;   Laterality: Right;  RIGHT BREAST BIOPSY WITH NEEDLE LOCALIZATION  . BREAST BIOPSY Left    No noticable scars anymore   . BREAST EXCISIONAL BIOPSY Right 2017  . BREAST EXCISIONAL BIOPSY Left over 10 years   no visible scar  . CHOLECYSTECTOMY    . FRACTURE SURGERY    . RADIOACTIVE SEED GUIDED EXCISIONAL BREAST BIOPSY Right 06/12/2016   Procedure: RIGHT RADIOACTIVE SEED GUIDED EXCISIONAL BREAST BIOPSY X 2;  Surgeon: Rolm Bookbinder, MD;  Location: [place];  Service: General;  Laterality: Right;  RIGHT RADIOACTIVE SEED GUIDED EXCISIONAL BREAST BIOPSY X 2  . TONSILLECTOMY       Current Outpatient Medications  Medication Sig Dispense Refill  . amitriptyline (ELAVIL) 50 MG tablet Take 50-100 mg by mouth at bedtime.    Marland Kitchen aspirin 81 MG chewable tablet Chew 81 mg by mouth daily.    Marland Kitchen atorvastatin (LIPITOR) 20 MG tablet Take 20 mg by mouth daily.    Marland Kitchen buPROPion (WELLBUTRIN XL) 300 MG 24 hr tablet TAKE 1 TABLET BY MOUTH EVERY DAY IN THE MORNING    . Cholecalciferol (VITAMIN D3) 3000 units TABS Take by mouth.    . diltiazem (CARDIZEM CD) 120 MG 24 hr capsule Take 1 capsule (120 mg total) by mouth at bedtime. 90 capsule 3  . ergocalciferol (VITAMIN D2) 50000 UNITS capsule Take 50,000 Units by mouth 2 (two) times a week. Sunday and thursday    . glucosamine-chondroitin 500-400 MG tablet Take  1 tablet by mouth 2 (two) times daily.     . Lactobacillus (PROBIOTIC ACIDOPHILUS PO) Take by mouth.    . metFORMIN (GLUCOPHAGE) 500 MG tablet Take 1,500 mg by mouth daily with breakfast.     . Multiple Vitamin (MULTIVITAMIN WITH MINERALS) TABS tablet Take 1 tablet by mouth daily.    Marland Kitchen omeprazole (PRILOSEC) 10 MG capsule Take 10 mg by mouth daily.    . propranolol (INDERAL) 20 MG tablet Take 1 tablet (20 mg total) by mouth 2 (two) times daily. 60 tablet 3  . raloxifene (EVISTA) 60 MG tablet TAKE 1 TABLET BY MOUTH EVERY DAY 90 tablet 4  . Turmeric 500 MG CAPS Take by mouth.     No current  facility-administered medications for this visit.    Allergies:   Erythromycin, Lodine [etodolac], and Metoprolol    Social History:  The patient  reports that she has never smoked. She has never used smokeless tobacco. She reports that she does not drink alcohol or use drugs.   Family History:  The patient's family history includes Alzheimer's disease in her maternal grandfather; Breast cancer in her maternal grandmother; CAD in her father; COPD in an other family member; Cancer in an other family member; Dementia in an other family member; Diabetes in an other family member; Diabetes type II in her brother; Heart disease in her paternal grandfather and another family member; Hyperlipidemia in an other family member; Hypertension in an other family member; Lymphoma in her paternal grandmother; Stroke in an other family member.    ROS: All other systems are reviewed and negative. Unless otherwise mentioned in H&P    PHYSICAL EXAM: VS:  There were no vitals taken for this visit. , BMI There is no height or weight on file to calculate BMI. GEN: Well nourished, well developed, in no acute distress HEENT: normal Neck: no JVD, carotid bruits, or masses Cardiac: ***RRR; no murmurs, rubs, or gallops,no edema  Respiratory:  Clear to auscultation bilaterally, normal work of breathing GI: soft, nontender, nondistended, + BS MS: no deformity or atrophy Skin: warm and dry, no rash Neuro:  Strength and sensation are intact Psych: euthymic mood, full affect   EKG:  EKG {ACTION; IS/IS [id number] ordered today. The ekg ordered today demonstrates ***   Recent Labs: 10/25/2019: ALT 32; BUN 18; Creatinine, Ser 0.79; Hemoglobin 12.4; Platelets 265; Potassium 4.3; Sodium 141    Lipid Panel No results found for: CHOL, TRIG, HDL, CHOLHDL, VLDL, LDLCALC, LDLDIRECT    Wt Readings from Last 3 Encounters:  11/15/19 218 lb (98.9 kg)  10/28/19 219 lb 8 oz (99.6 kg)  10/12/19 214 lb (97.1 kg)       Other studies Reviewed: Cardiac Monitor Patient had a min HR of 72 bpm, max HR of 148 bpm, and avg HR of 98 bpm. Predominant underlying rhythm was Sinus Rhythm. 1 run of Supraventricular Tachycardia occurred lasting 8 beats with a max rate of 148 bpm (avg 128 bpm). Isolated SVEs were rare (<1.0%), SVE Couplets were rare (<1.0%), and SVE Triplets were rare (<1.0%). Isolated VEs wererare (<1.0%), and no VE Couplets or VE Triplets were present.  ASSESSMENT AND PLAN:  1.  ***   Current medicines are reviewed at length with the patient today.    Labs/ tests ordered today include: *** Phill Myron. West Pugh, ANP, [facility]   12/25/2019 7:28 AM    [facility] [address] Office [phone] Fax [phone]  Notice: This dictation  was prepared with Dragon dictation along with smaller phrase technology. Any transcriptional errors that result from this process are unintentional and may not be corrected upon review.

## 2019-12-27 ENCOUNTER — Telehealth: Payer: Self-pay

## 2019-12-27 ENCOUNTER — Ambulatory Visit: Payer: Managed Care, Other (non HMO) | Admitting: Adult Health

## 2019-12-27 NOTE — Telephone Encounter (Signed)
Pt was to be seen today 1/18 by K. Purcell Nails, [place]. No show. Contacted pt to reschedule. No answer. LMTCB to reschedule appt

## 2019-12-28 ENCOUNTER — Telehealth: Payer: Self-pay

## 2019-12-28 ENCOUNTER — Telehealth (INDEPENDENT_AMBULATORY_CARE_PROVIDER_SITE_OTHER): Payer: Managed Care, Other (non HMO) | Admitting: Adult Health

## 2019-12-28 VITALS — BP 129/93 | HR 89

## 2019-12-28 DIAGNOSIS — E119 Type 2 diabetes mellitus without complications: Secondary | ICD-10-CM

## 2019-12-28 DIAGNOSIS — R002 Palpitations: Secondary | ICD-10-CM

## 2019-12-28 DIAGNOSIS — E785 Hyperlipidemia, unspecified: Secondary | ICD-10-CM

## 2019-12-28 DIAGNOSIS — I10 Essential (primary) hypertension: Secondary | ICD-10-CM

## 2019-12-28 NOTE — Telephone Encounter (Signed)
Patient and/or DPR-approved person aware of 12/28/2019 AVS instructions and verbalized understanding.  Pt aware that AVS will be available on mychart to review

## 2019-12-28 NOTE — Progress Notes (Signed)
Virtual Visit via Video Note   This visit type was conducted due to national recommendations for restrictions regarding the COVID-19 Pandemic (e.g. social distancing) in an effort to limit this patient's exposure and mitigate transmission in our community.  Due to her co-morbid illnesses, this patient is at least at moderate risk for complications without adequate follow up.  This format is felt to be most appropriate for this patient at this time.  All issues noted in this document were discussed and addressed.  A limited physical exam was performed with this format.  Please refer to the patient's chart for her consent to telehealth for [facility].   Date:  12/28/2019   ID:  Rachel Nixon, DOB 06/09/1956, MRN [id number]  Patient Location: Home Provider Location: Home  PCP:  Maurice Small, MD  Cardiologist:  Dr. Oval Linsey Electrophysiologist:  None   Evaluation Performed:  Follow-Up Visit  Chief Complaint:  Palpitations   History of Present Illness:    Rachel Nixon is a 64 y.o. female we are following for ongoing assessment forhypertension, hyperlipidemia. Other hx ofatypical ductal hyperplasia, diabetes mellitus, GERD and obesityshe is also being treated by Dr. Jana Hakim for atypical ductal hyperplasia.  There was no evidence of malignancy in either breast per mammogram on 10/12/2018.  She was evaluated last by televisit on 10/12/2019 by me.  Lisinopril was discontinued and she was started on diltiazem 120 mg daily which she was to take at bedtime.  This was to help with blood pressure and frequent palpitations.  She stated that her heart rate runs between 115 and 120 bpm while at rest.  She was offered short acting 60 mg twice daily but preferred to 1 dose medication as she stated that she would forget to take multiple doses.  She was to take her blood pressure and record it daily.  When I saw her last on 11/15/2019 she was complaining of a good bit of anxiety and this was  reflected in her blood pressure causing hypertension.  She also had elevated heart rates between 98 and 100 bpm.  She stated she was in a high stress job and it also admitted that she had a good bit of family stress as well.  She was considering psychological counseling to help her with the grief associated with multiple deaths in her family over the last few years.  A cardiac monitor was reviewed and it revealed that her average heart rate was 98 bpm.  I started her on low-dose propanolol 20 mg twice daily.  She was to follow-up to evaluate her response to medication today.  In the interim, she was to follow-up with her PCP who had recently checked her TSH.  We will also have to evaluate her blood pressure response with the addition of propanolol, to avoid hypotension.  She was encouraged to follow-up with a psychological counselor to assist her in her psychological stress and anxiety.  The patient does not have symptoms concerning for COVID-19 infection (fever, chills, cough, or new shortness of breath).    Past Medical History:  Diagnosis Date  . Allergy   . Anxiety   . Arthritis    hips, knees, hands  . Depression   . Diabetes mellitus without complication ([place])   . GERD (gastroesophageal reflux disease)   . Hyperlipidemia   . Hypertension   . 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 Migraine headache   . Obesity    Past Surgical History:  Procedure Laterality Date  . ABDOMINAL  HYSTERECTOMY    . BREAST BIOPSY Right 06/12/2016   Procedure: RIGHT BREAST BIOPSY WITH NEEDLE LOCALIZATION;  Surgeon: Rolm Bookbinder, MD;  Location: [place];  Service: General;  Laterality: Right;  RIGHT BREAST BIOPSY WITH NEEDLE LOCALIZATION  . BREAST BIOPSY Left    No noticable scars anymore   . BREAST EXCISIONAL BIOPSY Right 2017  . BREAST EXCISIONAL BIOPSY Left over 10 years   no visible scar  . CHOLECYSTECTOMY    . FRACTURE SURGERY    . RADIOACTIVE SEED GUIDED EXCISIONAL BREAST  BIOPSY Right 06/12/2016   Procedure: RIGHT RADIOACTIVE SEED GUIDED EXCISIONAL BREAST BIOPSY X 2;  Surgeon: Rolm Bookbinder, MD;  Location: [place];  Service: General;  Laterality: Right;  RIGHT RADIOACTIVE SEED GUIDED EXCISIONAL BREAST BIOPSY X 2  . TONSILLECTOMY       Current Meds  Medication Sig  . amitriptyline (ELAVIL) 50 MG tablet Take 50-100 mg by mouth at bedtime.  Marland Kitchen aspirin 81 MG chewable tablet Chew 81 mg by mouth daily.  Marland Kitchen atorvastatin (LIPITOR) 20 MG tablet Take 20 mg by mouth daily.  Marland Kitchen buPROPion (WELLBUTRIN XL) 300 MG 24 hr tablet TAKE 1 TABLET BY MOUTH EVERY DAY IN THE MORNING  . Cholecalciferol (VITAMIN D3) 3000 units TABS Take 3,000 Units by mouth 3 (three) times a week.   . diltiazem (CARDIZEM CD) 120 MG 24 hr capsule Take 1 capsule (120 mg total) by mouth at bedtime.  . ergocalciferol (VITAMIN D2) 50000 UNITS capsule Take 50,000 Units by mouth 2 (two) times a week. Sunday and thursday  . glucosamine-chondroitin 500-400 MG tablet Take 1 tablet by mouth 2 (two) times daily.   . Lactobacillus (PROBIOTIC ACIDOPHILUS PO) Take 1,200 mg by mouth daily.   . metFORMIN (GLUCOPHAGE) 500 MG tablet Take 1,500 mg by mouth daily with supper.   . Multiple Vitamin (MULTIVITAMIN WITH MINERALS) TABS tablet Take 1 tablet by mouth daily.  Marland Kitchen omeprazole (PRILOSEC) 10 MG capsule Take 10 mg by mouth daily.  . propranolol (INDERAL) 20 MG tablet Take 1 tablet (20 mg total) by mouth 2 (two) times daily.  . raloxifene (EVISTA) 60 MG tablet TAKE 1 TABLET BY MOUTH EVERY DAY  . Turmeric 500 MG CAPS Take by mouth 2 (two) times daily.      Allergies:   Erythromycin, Lodine [etodolac], and Metoprolol   Social History   Tobacco Use  . Smoking status: Never Smoker  . Smokeless tobacco: Never Used  Substance Use Topics  . Alcohol use: No  . Drug use: No     Family Hx: The patient's family history includes Alzheimer's disease in her maternal grandfather; Breast cancer in her maternal  grandmother; CAD in her father; COPD in an other family member; Cancer in an other family member; Dementia in an other family member; Diabetes in an other family member; Diabetes type II in her brother; Heart disease in her paternal grandfather and another family member; Hyperlipidemia in an other family member; Hypertension in an other family member; Lymphoma in her paternal grandmother; Stroke in an other family member.  ROS:   Please see the history of present illness.    All other systems reviewed and are negative.   Prior CV studies:   The following studies were reviewed today: Cardiac monitor 08/30/2019 Patient had a min HR of 72 bpm, max HR of 148 bpm, and avg HR of 98 bpm. Predominant underlying rhythm was Sinus Rhythm. 1 run of Supraventricular Tachycardia occurred lasting 8 beats with  a max rate of 148 bpm (avg 128 bpm). Isolated SVEs were rare (<1.0%), SVE Couplets were rare (<1.0%), and SVE Triplets were rare (<1.0%). Isolated VEs were rare (<1.0%), and no VE Couplets or VE Triplets were present.  Labs/Other Tests and Data Reviewed:    EKG:  No ECG reviewed.  Recent Labs: 10/25/2019: ALT 32; BUN 18; Creatinine, Ser 0.79; Hemoglobin 12.4; Platelets 265; Potassium 4.3; Sodium 141   Recent Lipid Panel No results found for: CHOL, TRIG, HDL, CHOLHDL, LDLCALC, LDLDIRECT  Wt Readings from Last 3 Encounters:  11/15/19 218 lb (98.9 kg)  10/28/19 219 lb 8 oz (99.6 kg)  10/12/19 214 lb (97.1 kg)     Objective:    Vital Signs:  BP (!) 129/93   Pulse 89    VITAL SIGNS:  reviewed GEN:  no acute distress EYES:  sclerae anicteric, EOMI - Extraocular Movements Intact RESPIRATORY:  normal respiratory effort, symmetric expansion NEURO:  alert and oriented x 3, no obvious focal deficit PSYCH:  normal affect  ASSESSMENT & PLAN:    1. Palpitations: Addition of propanolol has been helpful to her She has noticed an improvement in her symptoms and less HR elevation throughout the  day. She has not had any fatigue, dizziness.  I will continue the propanolol as she is tolerating this well and having good HR response.   2, Hypertension: BP is well controlled. No evidence of hypotension on home BP monitoring. Continue diltiazem 120 mg daily,   3. Hyperlipidemia: Continues on Lipitor 20 mg daily. Will need follow up fasting lipids and LFT's on next visit, unless completed by PCP in routine visit.  4. NIDDM: Followed by PCP.   COVID-19 Education: The signs and symptoms of COVID-19 were discussed with the patient and how to seek care for testing (follow up with PCP or arrange E-visit).  The importance of social distancing was discussed today.  Time:   Today, I have spent 20  minutes with the patient with telehealth technology discussing the above problems.     Medication Adjustments/Labs and Tests Ordered: Current medicines are reviewed at length with the patient today.  Concerns regarding medicines are outlined above.   Tests Ordered: No orders of the defined types were placed in this encounter.   Medication Changes: No orders of the defined types were placed in this encounter.    Disposition:  Follow up 6 months unless new symptoms  Signed, Phill Myron. West Pugh, ANP, AACC  12/28/2019 9:13 AM    [facility]

## 2019-12-28 NOTE — Patient Instructions (Signed)
   Medication Instructions:  NONE *If you need a refill on your cardiac medications before your next appointment, please call your pharmacy*  Lab Work: NONE If you have labs (blood work) drawn today and your tests are completely normal, you will receive your results only by: Marland Kitchen MyChart Message (if you have MyChart) OR . A paper copy in the mail If you have any lab test that is abnormal or we need to change your treatment, we will call you to review the results.  Testing/Procedures: NONE  Follow-Up: At [facility], you and your health needs are our priority.  As part of our continuing mission to provide you with exceptional heart care, we have created designated Provider Care Teams.  These Care Teams include your primary Cardiologist (physician) and Advanced Practice Providers (APPs -  Physician Assistants and Nurse Practitioners) who all work together to provide you with the care you need, when you need it.  Your next appointment:   6 month(s)  The format for your next appointment:   In Person  Provider:   Skeet Latch, MD  Other Instructions [place]

## 2019-12-29 ENCOUNTER — Other Ambulatory Visit: Payer: Managed Care, Other (non HMO)

## 2020-01-05 ENCOUNTER — Ambulatory Visit: Payer: Managed Care, Other (non HMO)

## 2020-01-14 ENCOUNTER — Ambulatory Visit: Payer: Managed Care, Other (non HMO)

## 2020-02-08 ENCOUNTER — Other Ambulatory Visit: Payer: Self-pay | Admitting: Adult Health

## 2020-02-09 ENCOUNTER — Other Ambulatory Visit: Payer: Self-pay | Admitting: Adult Health

## 2020-03-14 ENCOUNTER — Other Ambulatory Visit: Payer: Self-pay

## 2020-03-14 ENCOUNTER — Ambulatory Visit
Admission: RE | Admit: 2020-03-14 | Discharge: 2020-03-14 | Disposition: A | Discharge status: Home / Self Care | Payer: Managed Care, Other (non HMO) | Source: Ambulatory Visit | Attending: Oncology | Admitting: Oncology

## 2020-03-14 DIAGNOSIS — N6091 Unspecified benign mammary dysplasia of right breast: Secondary | ICD-10-CM

## 2020-03-14 DIAGNOSIS — I10 Essential (primary) hypertension: Secondary | ICD-10-CM

## 2020-09-02 IMAGING — US US BREAST*L* LIMITED INC AXILLA
1 series · 7 of 7 positions shown · non-contrast
Comparison: Previous exam(s).

CLINICAL DATA: 63-year-old female presenting for follow-up of a
large hematoma in the upper inner quadrant of the left breast. She
had a fall with significant trauma to the left breast 11/08/2018
after which she developed a palpable mass. She presented for
evaluation 01/01/2019 for a diagnostic ultrasound demonstrating a
6.1 cm hematoma. The mass has continually decreased in size.The
patient has history of excisional biopsy of the right breast showing
papilloma, radial scar and atypical ductal hyperplasia.

EXAM:
DIGITAL DIAGNOSTIC BILATERAL MAMMOGRAM WITH CAD AND TOMO
LEFT BREAST ULTRASOUND

[Series 1: us breast*left* limited inc axilla · 0.06mm/px · 7 of 7 slices shown]
[im 1/7]
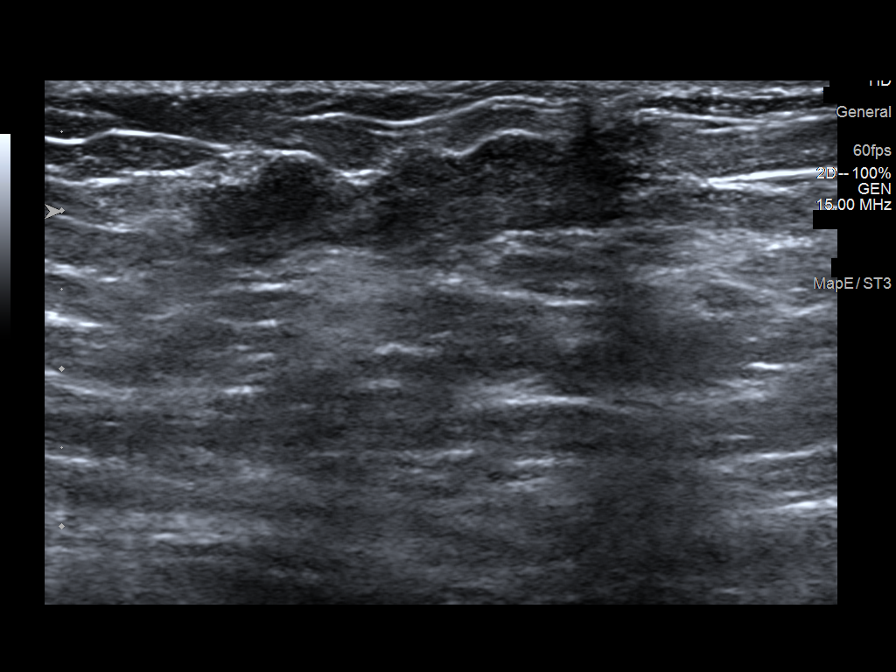
[im 2/7]
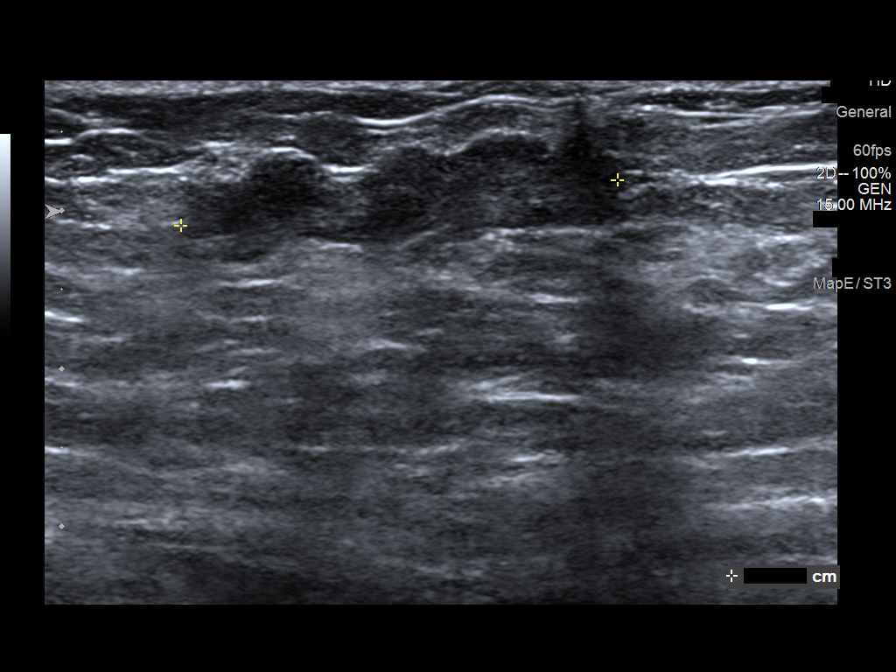
[im 3/7]
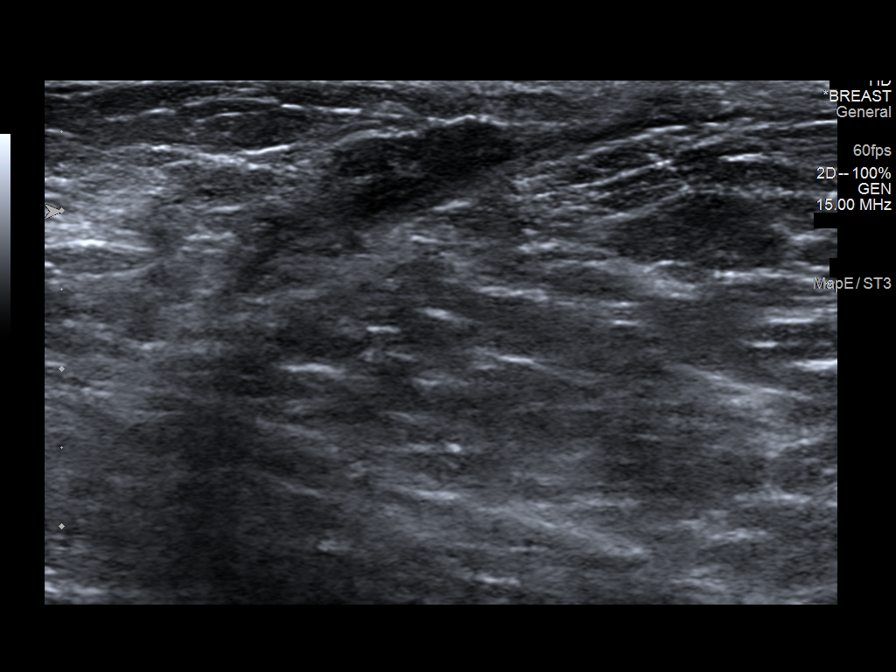
[im 4/7]
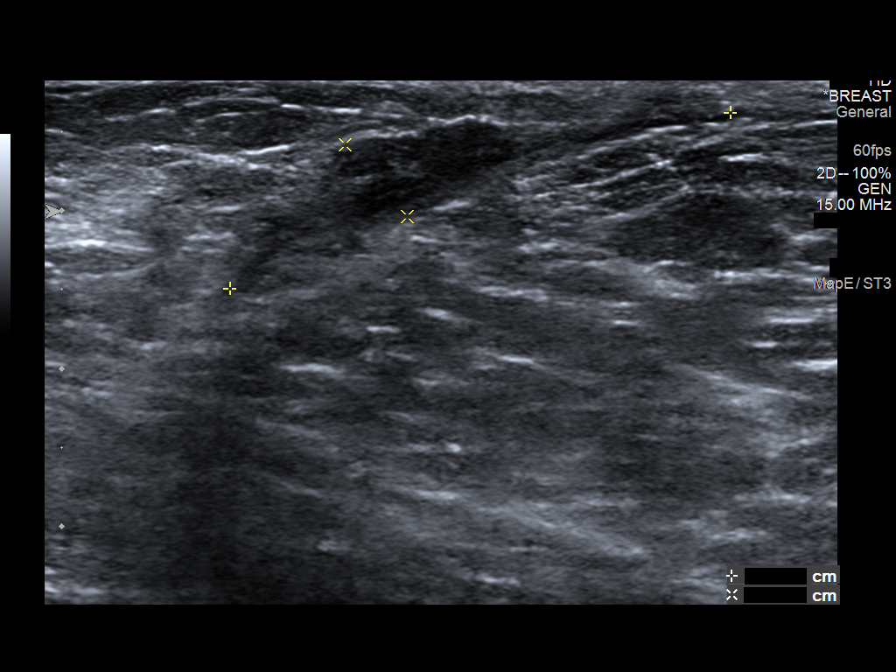
[im 5/7]
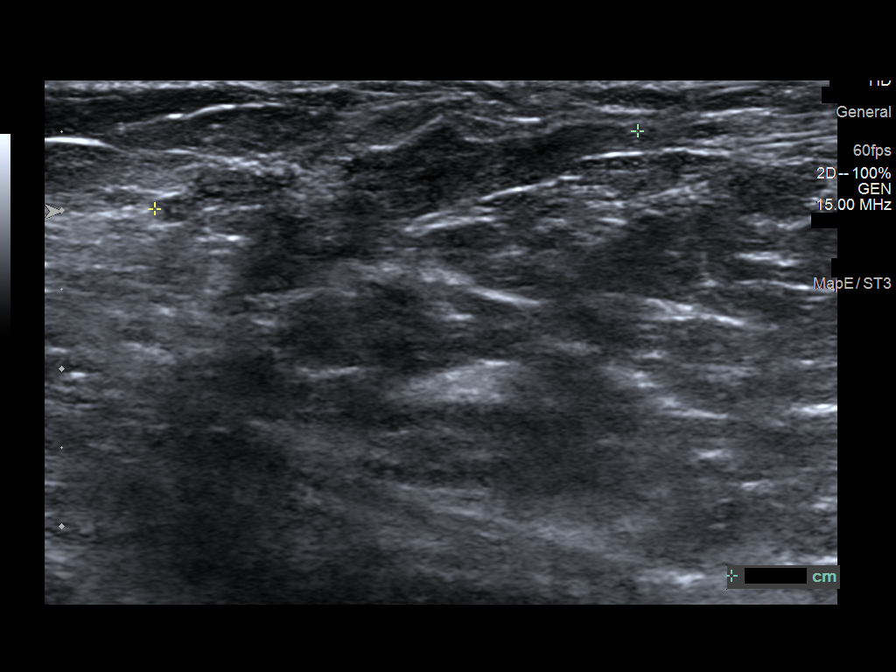
[im 6/7]
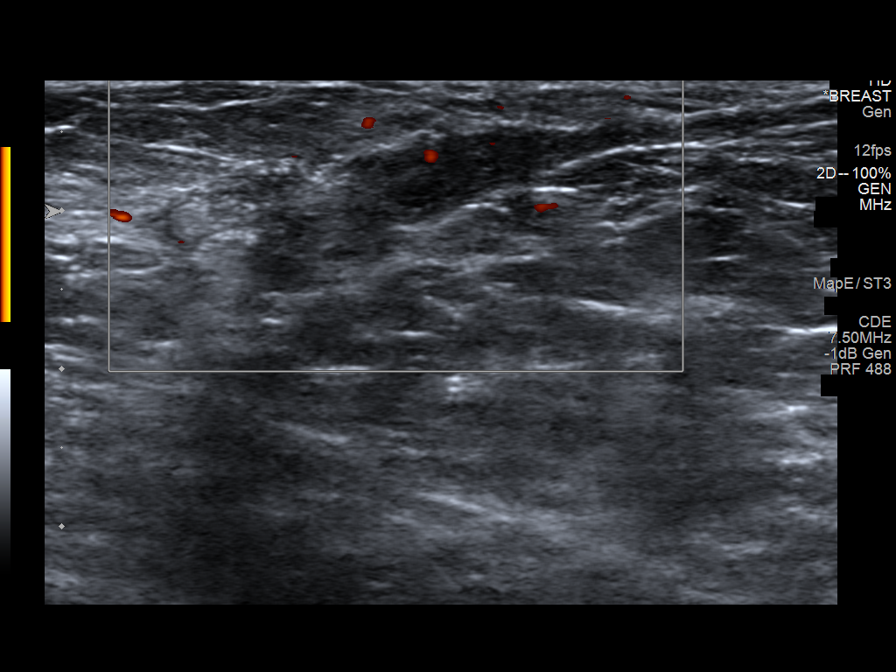
[im 7/7]
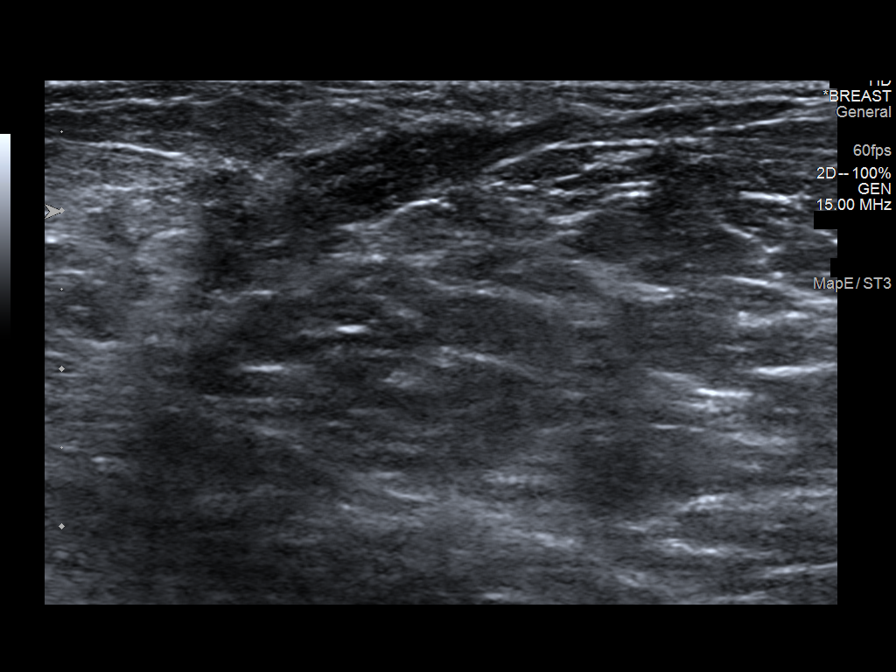

[7 of 7 positions shown; findings below may reference images not displayed]

ACR Breast Density Category c: The breast tissue is heterogeneously
dense, which may obscure small masses.
FINDINGS: In the upper inner quadrant of the left breast, presumably at the
site of the prior hematoma in the middle to posterior depth, there
is a broad area of distortion. No other suspicious calcifications,
masses or areas of distortion are seen in the bilateral breasts.

Mammographic images were processed with CAD.

Physical exam of the upper inner quadrant of the left breast
demonstrates a firm superficial elongated palpable mass.

Ultrasound targeted to 10 o'clock, 8 cm from the nipple demonstrates
an elongated hypoechoic mass extending to the skin surface measuring
3.4 x 0.6 x 3.1 cm. The hematoma previously at this site measured
6.1 x 3.6 x 5.6 cm.
IMPRESSION: 1. Resolution of the large hematoma in the left breast at 10
o'clock. Now at this location, there is a broad area of distortion
on mammogram and scarring by ultrasound. This is the expected
evolution of healing from significant breast trauma.

2.  No mammographic evidence of malignancy in the bilateral breasts.

RECOMMENDATION:
One additional diagnostic bilateral mammogram is suggested in 1
year. (Code:[id number])

I have discussed the findings and recommendations with the patient.
If applicable, a reminder letter will be sent to the patient
regarding the next appointment.

BI-RADS CATEGORY  3: Probably benign.

## 2020-09-15 ENCOUNTER — Other Ambulatory Visit: Payer: Managed Care, Other (non HMO)

## 2020-10-10 ENCOUNTER — Other Ambulatory Visit: Payer: Self-pay

## 2020-10-10 MED ORDER — PROPRANOLOL HCL 20 MG PO TABS: 20.0000 mg | ORAL_TABLET | Freq: Two times a day (BID) | ORAL | 2 refills | Status: DC

## 2020-10-11 ENCOUNTER — Other Ambulatory Visit: Payer: Self-pay | Admitting: Cardiovascular Disease

## 2020-10-11 NOTE — Telephone Encounter (Signed)
*  STAT* If patient is at the pharmacy, call can be transferred to refill team.   1. Which medications need to be refilled? (please list name of each medication and dose if known) diltiazem (CARDIZEM CD) 120 MG 24 hr capsule  2. Which pharmacy/location (including street and city if local pharmacy) is medication to be sent to? [id number] - [place], [place] - [address] AT [address]  3. Do they need a 30 day or 90 day supply? [place]

## 2020-10-12 MED ORDER — DILTIAZEM HCL ER COATED BEADS 120 MG PO CP24: 120.0000 mg | ORAL_CAPSULE | Freq: Every day | ORAL | 2 refills | Status: DC

## 2020-11-08 ENCOUNTER — Inpatient Hospital Stay: Payer: Managed Care, Other (non HMO) | Admitting: Oncology

## 2020-11-08 ENCOUNTER — Inpatient Hospital Stay: Payer: Managed Care, Other (non HMO)

## 2020-11-24 ENCOUNTER — Other Ambulatory Visit: Payer: Self-pay | Admitting: Family Medicine

## 2020-11-24 DIAGNOSIS — E2839 Other primary ovarian failure: Secondary | ICD-10-CM

## 2020-12-10 ENCOUNTER — Other Ambulatory Visit: Payer: Self-pay | Admitting: Oncology

## 2020-12-17 NOTE — Progress Notes (Signed)
[place]  [phone] Fax:([phone]) [phone]     ID: Rachel Nixon DOB: [birth year]  MR#: [id number]  ZHY#:[id number]  Patient Care Team: Maurice Small, MD as PCP - General (Family Medicine) Rolm Bookbinder, MD as Consulting Physician (General Surgery) Bryler Dibble, Virgie Dad, MD as Consulting Physician (Oncology) Rachel Essex, MD as Consulting Physician (Gastroenterology) Skeet Latch, MD as Attending Physician (Cardiology) OTHER MD:  CHIEF COMPLAINT: Atypical ductal hyperplasia  CURRENT TREATMENT: Completing 5 years of raloxifene/Evista   INTERVAL HISTORY: Rachel Nixon returns today for follow-up of her atypical ductal hyperplasia.   She continues on raloxifene, which she tolerates well.  Specifically hot flashes and vaginal wetness are not an issue.  Since her last visit, she underwent bilateral diagnostic mammography with tomography and left breast ultrasonography at [facility] on 03/14/2020 showing: breast density category C; resolution of left breast hematoma; no evidence of malignancy in either breast.  1 additional diagnostic mammography was suggested a year following  She is scheduled for bone density screening on 03/05/2021.   REVIEW OF SYSTEMS: Rachel Nixon is doing "great" in general, enjoyed visiting her family in [place].  She is considering retiring later this year.  For exercise she walks and she does a video also at home.   COVID 19 VACCINATION STATUS: Received Moderna x2 followed by a [place] booster on 09/30/2020   BREAST CANCER HISTORY: From the original intake note:  Rachel Nixon had routine bilateral screening mammography at the [facility] 07/04/2015 showing an area of possible distortion in the right breast. On 07/10/2015 she underwent right diagnostic mammography with tomosynthesis and ultrasonography. The breast density was category C. This confirmed an area of architectural distortion in the lower outer quadrant of the right breast  associated with coarse and punctate calcifications more medially. Ultrasonography was negative. Biopsy of the area of distortion in the right breast 07/18/2015 showed ([id number]) no atypia or malignancy.  Six-month follow-up mammography was recommended, and performed 02/22/2016. The area of distortion in the lower outer right breast was unchanged. There was an additional area of distortion more posterior which was also felt to be mammographically stable. The 4 mm group of likely dystrophic calcifications also appear unchanged. Physical exam was benign. Whenever targeted ultrasound of the right breast showed a dilated duct in the subareolar right breast at the 6:00 position, measuring 0.9 cm. There was no evidence of lymphadenopathy.  Because of the questionable findings, bilateral MRI was recommended and performed 04/12/2016. This showed in the lower outer right breast at 1.0 cm area of distortion which had been biopsied in August 2016. There was abnormal intraductal enhancement measuring 1.5 cm in the right subareolar region. The left breast was unremarkable and there was no adenopathy of concern.  On 05/01/2016, she underwent MRI guided biopsy of the lower outer quadrant area in question and this showed a complex sclerosing lesion ([id number]). The patient was then referred to surgery and after appropriate discussion underwent biopsy of the 3 areas in question in the right breast 06/12/2016. 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 The margins were clear. Patient is being referred for discussion of breast cancer high-risk management   Her subsequent history is as detailed below    PAST MEDICAL HISTORY: Past Medical History:  Diagnosis Date  . Allergy   . Anxiety   . Arthritis    hips, knees, hands  . Depression   . Diabetes mellitus without complication ([facility])   .  GERD (gastroesophageal  reflux disease)   . Hyperlipidemia   . Hypertension   . 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 Migraine headache   . Obesity     PAST SURGICAL HISTORY: Past Surgical History:  Procedure Laterality Date  . ABDOMINAL HYSTERECTOMY    . BREAST BIOPSY Right 06/12/2016   Procedure: RIGHT BREAST BIOPSY WITH NEEDLE LOCALIZATION;  Surgeon: Emelia LoronMatthew Wakefield, MD;  Location: [facility];  Service: General;  Laterality: Right;  RIGHT BREAST BIOPSY WITH NEEDLE LOCALIZATION  . BREAST BIOPSY Left    No noticable scars anymore   . BREAST EXCISIONAL BIOPSY Right 2017  . BREAST EXCISIONAL BIOPSY Left over 10 years   no visible scar  . CHOLECYSTECTOMY    . FRACTURE SURGERY    . RADIOACTIVE SEED GUIDED EXCISIONAL BREAST BIOPSY Right 06/12/2016   Procedure: RIGHT RADIOACTIVE SEED GUIDED EXCISIONAL BREAST BIOPSY X 2;  Surgeon: Emelia LoronMatthew Wakefield, MD;  Location: [facility];  Service: General;  Laterality: Right;  RIGHT RADIOACTIVE SEED GUIDED EXCISIONAL BREAST BIOPSY X 2  . TONSILLECTOMY      FAMILY HISTORY Family History  Problem Relation Age of Onset  . CAD Father   . Diabetes type II Brother   . Cancer Other   . Hypertension Other   . COPD Other   . Hyperlipidemia Other   . Diabetes Other   . Stroke Other   . Heart disease Other   . Dementia Other   . Breast cancer Maternal Grandmother   . Alzheimer's disease Maternal Grandfather   . Lymphoma Paternal Grandmother   . Heart disease Paternal Grandfather   The patient's father died at age 67 due to cardiac arrest. The patient's mother died at age 67 due to lymphoma (she died before an official diagnosis). The patient has one brother, no sisters. The paternal grandmother had breast cancer at a late age. A maternal grandmother had non-Hodgkin's lymphoma.   GYNECOLOGIC HISTORY:  No LMP recorded. Patient has had a hysterectomy. Menarche age 65, first live birth age 65, the patient is GX P1. She underwent simple  hysterectomy without salpingo-oophorectomy in 2001 and took hormone replacement approximately 7 years. She had no complications   SOCIAL HISTORY: Updated 09/11/2018 Rachel FanningJulie works as a Librarian, academiclegal assistant. She is divorced and lives by herself, with no pets. Son Rachel DeerChristopher lives in [place] and works as a Emergency planning/management officerproject manager for [employer]. Rachel OhmChris' wife is a Charity fundraiserN for the same company. The patient has 2 grandchildren, one of whom has a rare genetics disorder. The patient attends the [employer]    ADVANCED DIRECTIVES: Her son is her healthcare power of attorney. He can be reached at [phone]   HEALTH MAINTENANCE: Social History   Tobacco Use  . Smoking status: Never Smoker  . Smokeless tobacco: Never Used  Substance Use Topics  . Alcohol use: No  . Drug use: No     Colonoscopy:  PAP:  Bone density:   Allergies  Allergen Reactions  . Erythromycin Nausea Only  . Lodine [Etodolac] Hives  . Metoprolol     Current Outpatient Medications  Medication Sig Dispense Refill  . ALPRAZolam (XANAX) 0.25 MG tablet Take 1 tablet (0.25 mg total) by mouth at bedtime as needed for anxiety. 30 tablet 0  . glipiZIDE (GLUCOTROL) 5 MG tablet Take by mouth daily before breakfast.    . sertraline (ZOLOFT) 25 MG tablet Take 1 tablet (25 mg total) by mouth daily.    Marland Kitchen. aspirin 81  MG chewable tablet Chew 81 mg by mouth daily.    Marland Kitchen atorvastatin (LIPITOR) 20 MG tablet Take 20 mg by mouth daily.    . Cholecalciferol (VITAMIN D3) 3000 units TABS Take 3,000 Units by mouth 3 (three) times a week.     . diltiazem (CARDIZEM CD) 120 MG 24 hr capsule Take 1 capsule (120 mg total) by mouth at bedtime. 90 capsule 2  . ergocalciferol (VITAMIN D2) 50000 UNITS capsule Take 50,000 Units by mouth 2 (two) times a week. Sunday and thursday    . glucosamine-chondroitin 500-400 MG tablet Take 1 tablet by mouth 2 (two) times daily.     . Lactobacillus (PROBIOTIC ACIDOPHILUS PO) Take 1,200 mg by mouth  daily.     . Multiple Vitamin (MULTIVITAMIN WITH MINERALS) TABS tablet Take 1 tablet by mouth daily.    Marland Kitchen omeprazole (PRILOSEC) 10 MG capsule Take 10 mg by mouth daily.    . propranolol (INDERAL) 20 MG tablet Take 1 tablet (20 mg total) by mouth 2 (two) times daily. 180 tablet 2  . raloxifene (EVISTA) 60 MG tablet TAKE 1 TABLET BY MOUTH EVERY DAY 90 tablet 4   No current facility-administered medications for this visit.    OBJECTIVE:  white woman who appears stated age 18:   12/18/20 1025  BP: 127/64  Pulse: 67  Resp: 18  Temp: (!) 97.5 F (36.4 C)  SpO2: 100%     Body mass index is 40.51 kg/m.    ECOG FS:0 - Asymptomatic  Sclerae unicteric, EOMs intact Wearing a mask No cervical or supraclavicular adenopathy Lungs no rales or rhonchi Heart regular rate and rhythm Abd soft, nontender, positive bowel sounds MSK no focal spinal tenderness, no upper extremity lymphedema Neuro: nonfocal, well oriented, appropriate affect Breasts: The right breast is status post lumpectomy but otherwise unremarkable.  The left breast is benign.  Both axillae are benign   LAB RESULTS:  CMP     Component Value Date/Time   NA 141 10/25/2019 0942   NA 138 07/24/2016 1615   K 4.3 10/25/2019 0942   K 4.3 07/24/2016 1615   CL 103 10/25/2019 0942   CO2 26 10/25/2019 0942   CO2 25 07/24/2016 1615   GLUCOSE 157 (H) 10/25/2019 0942   GLUCOSE 105 07/24/2016 1615   BUN 18 10/25/2019 0942   BUN 14.5 07/24/2016 1615   CREATININE 0.79 10/25/2019 0942   CREATININE 0.73 08/31/2018 1249   CREATININE 0.9 07/24/2016 1615   CALCIUM 8.8 (L) 10/25/2019 0942   CALCIUM 9.9 07/24/2016 1615   PROT 6.7 10/25/2019 0942   PROT 7.4 07/24/2016 1615   ALBUMIN 3.3 (L) 10/25/2019 0942   ALBUMIN 3.7 07/24/2016 1615   AST 22 10/25/2019 0942   AST 25 08/31/2018 1249   AST 31 07/24/2016 1615   ALT 32 10/25/2019 0942   ALT 30 08/31/2018 1249   ALT 35 07/24/2016 1615   ALKPHOS 98 10/25/2019 0942   ALKPHOS 131  07/24/2016 1615   BILITOT 0.4 10/25/2019 0942   BILITOT <0.2 (L) 08/31/2018 1249   BILITOT 0.36 07/24/2016 1615   GFRNONAA >60 10/25/2019 0942   GFRNONAA >60 08/31/2018 1249   GFRAA >60 10/25/2019 0942   GFRAA >60 08/31/2018 1249    INo results found for: SPEP, UPEP  Lab Results  Component Value Date   WBC 8.1 12/18/2020   NEUTROABS 4.9 12/18/2020   HGB 12.8 12/18/2020   HCT 41.7 12/18/2020   MCV 80.0 12/18/2020   PLT 260 12/18/2020  Chemistry      Component Value Date/Time   NA 141 10/25/2019 0942   NA 138 07/24/2016 1615   K 4.3 10/25/2019 0942   K 4.3 07/24/2016 1615   CL 103 10/25/2019 0942   CO2 26 10/25/2019 0942   CO2 25 07/24/2016 1615   BUN 18 10/25/2019 0942   BUN 14.5 07/24/2016 1615   CREATININE 0.79 10/25/2019 0942   CREATININE 0.73 08/31/2018 1249   CREATININE 0.9 07/24/2016 1615      Component Value Date/Time   CALCIUM 8.8 (L) 10/25/2019 0942   CALCIUM 9.9 07/24/2016 1615   ALKPHOS 98 10/25/2019 0942   ALKPHOS 131 07/24/2016 1615   AST 22 10/25/2019 0942   AST 25 08/31/2018 1249   AST 31 07/24/2016 1615   ALT 32 10/25/2019 0942   ALT 30 08/31/2018 1249   ALT 35 07/24/2016 1615   BILITOT 0.4 10/25/2019 0942   BILITOT <0.2 (L) 08/31/2018 1249   BILITOT 0.36 07/24/2016 1615       No results found for: LABCA2  No components found for: LABCA125  No results for input(s): INR in the last 168 hours.  Urinalysis No results found for: COLORURINE, APPEARANCEUR, LABSPEC, PHURINE, GLUCOSEU, HGBUR, BILIRUBINUR, KETONESUR, PROTEINUR, UROBILINOGEN, NITRITE, LEUKOCYTESUR   STUDIES: No results found.   ELIGIBLE FOR AVAILABLE RESEARCH PROTOCOL: no  ASSESSMENT: 65 y.o. [place] woman status post right breast biopsy 06/12/2016 for atypical ductal hyperplasia in the setting of a radial scar  (1) breast density category C  (2) started raloxifene for risk reduction 07/24/2016, completing 5 years 07/2021  (3) left breast seroma secondary to  trauma from a fall December 2019: Resolved  PLAN: Rachel Nixon will complete 5 years of raloxifene that is August.  She understands when raloxifene is used for risk reduction we do not go beyond 5years.  She is very agreeable to stopping the medication at that time.  By taking it she has essentially cut her risk of developing breast cancer in half.  At this point I feel comfortable releasing her to her primary care physician's care.  All Rachel Nixon will need as far as breast cancer is concerned is yearly mammography and a yearly physician breast exam.  I will be glad to see her again at any point in the future if and when the need arises but as of now are making no further routine appointments for her here.  Total encounter time 20 minutes.*   Leonard Feigel, Virgie Dad, MD  12/18/20 10:44 AM Medical Oncology and Hematology [facility], [place] [address] Tel. [phone]    Fax. [phone]   I, Wilburn Mylar, am acting as scribe for Dr. Virgie Dad. Candise Crabtree.  I, Lurline Del MD, have reviewed the above documentation for accuracy and completeness, and I agree with the above.   *Total Encounter Time as defined by the Centers for Medicare and Medicaid Services includes, in addition to the face-to-face time of a patient visit (documented in the note above) non-face-to-face time: obtaining and reviewing outside history, ordering and reviewing medications, tests or procedures, care coordination (communications with other health care professionals or caregivers) and documentation in the medical record.

## 2020-12-18 ENCOUNTER — Other Ambulatory Visit: Payer: Self-pay

## 2020-12-18 ENCOUNTER — Inpatient Hospital Stay: Payer: Managed Care, Other (non HMO) | Attending: Oncology

## 2020-12-18 ENCOUNTER — Inpatient Hospital Stay: Payer: Managed Care, Other (non HMO) | Admitting: Oncology

## 2020-12-18 VITALS — BP 127/64 | HR 67 | Temp 97.5°F | Resp 18 | Ht 63.0 in | Wt 228.7 lb

## 2020-12-18 DIAGNOSIS — Z79899 Other long term (current) drug therapy: Secondary | ICD-10-CM | POA: Insufficient documentation

## 2020-12-18 DIAGNOSIS — K219 Gastro-esophageal reflux disease without esophagitis: Secondary | ICD-10-CM | POA: Diagnosis not present

## 2020-12-18 DIAGNOSIS — E785 Hyperlipidemia, unspecified: Secondary | ICD-10-CM | POA: Insufficient documentation

## 2020-12-18 DIAGNOSIS — Z7984 Long term (current) use of oral hypoglycemic drugs: Secondary | ICD-10-CM | POA: Insufficient documentation

## 2020-12-18 DIAGNOSIS — M1990 Unspecified osteoarthritis, unspecified site: Secondary | ICD-10-CM | POA: Diagnosis not present

## 2020-12-18 DIAGNOSIS — F418 Other specified anxiety disorders: Secondary | ICD-10-CM | POA: Insufficient documentation

## 2020-12-18 DIAGNOSIS — Z7982 Long term (current) use of aspirin: Secondary | ICD-10-CM | POA: Diagnosis not present

## 2020-12-18 DIAGNOSIS — N6091 Unspecified benign mammary dysplasia of right breast: Secondary | ICD-10-CM

## 2020-12-18 DIAGNOSIS — E669 Obesity, unspecified: Secondary | ICD-10-CM | POA: Diagnosis not present

## 2020-12-18 DIAGNOSIS — E119 Type 2 diabetes mellitus without complications: Secondary | ICD-10-CM | POA: Diagnosis not present

## 2020-12-18 DIAGNOSIS — I10 Essential (primary) hypertension: Secondary | ICD-10-CM | POA: Insufficient documentation

## 2020-12-18 LAB — CBC WITH DIFFERENTIAL/PLATELET
Abs Immature Granulocytes: 0.02 10*3/uL (ref 0.00–0.07)
Basophils Absolute: 0 10*3/uL (ref 0.0–0.1)
Basophils Relative: 0 %
Eosinophils Absolute: 0.2 10*3/uL (ref 0.0–0.5)
Eosinophils Relative: 2 %
HCT: 41.7 % (ref 36.0–46.0)
Hemoglobin: 12.8 g/dL (ref 12.0–15.0)
Immature Granulocytes: 0 %
Lymphocytes Relative: 26 %
Lymphs Abs: 2.1 10*3/uL (ref 0.7–4.0)
MCH: 24.6 pg — ABNORMAL LOW (ref 26.0–34.0)
MCHC: 30.7 g/dL (ref 30.0–36.0)
MCV: 80 fL (ref 80.0–100.0)
Monocytes Absolute: 0.9 10*3/uL (ref 0.1–1.0)
Monocytes Relative: 11 %
Neutro Abs: 4.9 10*3/uL (ref 1.7–7.7)
Neutrophils Relative %: 61 %
Platelets: 260 10*3/uL (ref 150–400)
RBC: 5.21 MIL/uL — ABNORMAL HIGH (ref 3.87–5.11)
RDW: 15.2 % (ref 11.5–15.5)
WBC: 8.1 10*3/uL (ref 4.0–10.5)
nRBC: 0 % (ref 0.0–0.2)

## 2020-12-18 LAB — COMPREHENSIVE METABOLIC PANEL
ALT: 18 U/L (ref 0–44)
AST: 18 U/L (ref 15–41)
Albumin: 3.4 g/dL — ABNORMAL LOW (ref 3.5–5.0)
Alkaline Phosphatase: 113 U/L (ref 38–126)
Anion gap: 8 (ref 5–15)
BUN: 20 mg/dL (ref 8–23)
CO2: 25 mmol/L (ref 22–32)
Calcium: 9.2 mg/dL (ref 8.9–10.3)
Chloride: 105 mmol/L (ref 98–111)
Creatinine, Ser: 0.73 mg/dL (ref 0.44–1.00)
GFR, Estimated: >60 mL/min (ref >60)
Glucose, Bld: 129 mg/dL — ABNORMAL HIGH (ref 70–99)
Potassium: 4.3 mmol/L (ref 3.5–5.1)
Sodium: 138 mmol/L (ref 135–145)
Total Bilirubin: 0.5 mg/dL (ref 0.3–1.2)
Total Protein: 6.9 g/dL (ref 6.5–8.1)

## 2020-12-20 ENCOUNTER — Telehealth: Payer: Self-pay | Admitting: Oncology

## 2020-12-20 NOTE — Telephone Encounter (Signed)
No 1/10 los. No changes made to pt's schedule.  

## 2021-03-05 ENCOUNTER — Other Ambulatory Visit: Payer: Managed Care, Other (non HMO)

## 2021-05-10 ENCOUNTER — Other Ambulatory Visit: Payer: Self-pay | Admitting: Oncology

## 2021-05-10 DIAGNOSIS — N6489 Other specified disorders of breast: Secondary | ICD-10-CM

## 2021-07-05 ENCOUNTER — Other Ambulatory Visit: Payer: Managed Care, Other (non HMO)

## 2021-07-05 ENCOUNTER — Encounter: Payer: Self-pay | Admitting: Gastroenterology

## 2021-07-05 ENCOUNTER — Ambulatory Visit: Payer: Managed Care, Other (non HMO) | Admitting: Gastroenterology

## 2021-07-05 VITALS — BP 130/86 | HR 77 | Ht 65.0 in | Wt 230.0 lb

## 2021-07-05 DIAGNOSIS — R110 Nausea: Secondary | ICD-10-CM

## 2021-07-05 DIAGNOSIS — R1084 Generalized abdominal pain: Secondary | ICD-10-CM | POA: Diagnosis not present

## 2021-07-05 DIAGNOSIS — K529 Noninfective gastroenteritis and colitis, unspecified: Secondary | ICD-10-CM | POA: Diagnosis not present

## 2021-07-05 DIAGNOSIS — K219 Gastro-esophageal reflux disease without esophagitis: Secondary | ICD-10-CM

## 2021-07-05 MED ORDER — ONDANSETRON HCL 4 MG PO TABS: ORAL_TABLET | ORAL | 1 refills | Status: DC

## 2021-07-05 MED ORDER — SUTAB 1479-225-188 MG PO TABS: 1.0000 | ORAL_TABLET | ORAL | 0 refills | Status: DC

## 2021-07-05 NOTE — Progress Notes (Signed)
Referring Provider: Maurice Small, MD Primary Care Physician:  Maurice Small, MD  Reason for Consultation: Chronic diarrhea   IMPRESSION:  Chronic diarrhea Tortuous colon on colonoscopy 2012 History of IBS with intermittently altered bowel habits Severe nausea with prior bowel prep  The differential diagnosis of chronic diarrhea without alarm features includes: irritable bowel syndrome, IBD, celiac disease, chronic infection (such as giardia), food intolerance, microscopic colitis, other functional GI disease. By history, this is less likely to be obstruction.  Discussed strategies for managing nausea with bowel prep. She requested Sutabs. Will also send her with instructions for Miralax/Gatorade if the Sutabs are not tolerated. Will supply a script for Zofran to use prior to the each dose of prep.    PLAN: - Fecal calprotectin screen for IBD - Giardia testing - Dicyclomine trial taken prior to meals - Avoid milk products, raw fruits, raw vegetables, high fat foods, artifical sweeteners, and carbonated beverages until symptoms resolve - EGD with duodenal biopsies  - Colonoscopy with random biopsies and evaluation of the terminal ileum - Obtain prior colonoscopy report from Dr. Watt Climes (last 5-10 years) and Dr. Collene Mares from 2009  I consented the patient at the bedside today discussing the risks, benefits, and alternatives to endoscopic evaluation. In particular, we discussed the risks that include, but are not limited to, reaction to medication, cardiopulmonary compromise, bleeding requiring blood transfusion, aspiration resulting in pneumonia, perforation requiring surgery, and even death. We reviewed the risk of missed lesion including polyps or even cancer. The patient acknowledges these risks and asks that we proceed.   Please see the "Patient Instructions" section for addition details about the plan.  HPI: Rachel Nixon is a 65 y.o. female referred by Dr. Justin Mend for chronic diarrhea.   The history is obtained through the patient, records provided by Dr. Justin Mend, and review of her electronic health record.  She has IBS, reflux, obesity, type 2 diabetes, hypercholesterolemia, migraines, hypertension, vitamin D deficiency, depression, and chronic fatigue.  Intermittent explosive watery diarrhea over the last year. Have 1-4 BM daily. No blood or mucous. May have some associated abdominal pain. Requires Imodium to leave the house and has been wearing undergarments. Has had accidents. No nocturnal symptoms.  Not related to eating. She was diagnosed with IBS years ago and intermittently had diarrhea associated with that. However, those symptoms occurred occasionally.  Drinks a Diet Coke every afternoon. Uses Stevia. No identified food triggers. Appetite is good. Weight is stable.   Initially started with metformin. But, with change from metformin the diarrhea has not improved. Started thyroid treatment after the diarrhea. Denies trauma, close contacts with similar symptoms, changes in diet, recent travel or antibiotic use.  Had antibiotics in March 2020 for pneumonia.   Trial of dicyclomine was just started and she has not yet noted an improvement. No improvement with PeptoBismal.  Labs 12/18/20: WBC 8.1, hgb 12.8, platelets 260, normal differential; normal CMP except glucose 129 and albumin 3.4   Prior endoscopic history: - ? Colonoscopy with Dr. Collene Mares in 2009 - Colonoscopy in 2012 for retrieval of a crown. Performed by a Psychologist, sport and exercise. No polyps seen. Colon noted to be tortuous.  - Colonoscopy with Dr. Watt Climes since then. Likely 5-10 years ago. Severe nausea with the prep.  - Reports a prior EGD with Dr. Watt Climes  No known family history of colon cancer or polyps. No family history of uterine/endometrial cancer, pancreatic cancer or gastric/stomach cancer.   Past Medical History:  Diagnosis Date   Allergy  Anxiety    Arthritis    hips, knees, hands   Depression    Diabetes mellitus  without complication ([facility])    GERD (gastroesophageal reflux disease)    Hyperlipidemia    Hypertension    Hypothyroidism    Inappropriate sinus tachycardia 07/29/2016   Migraine headache    Obesity     Past Surgical History:  Procedure Laterality Date   ABDOMINAL HYSTERECTOMY     BREAST BIOPSY Right 06/12/2016   Procedure: RIGHT BREAST BIOPSY WITH NEEDLE LOCALIZATION;  Surgeon: Rolm Bookbinder, MD;  Location: [facility];  Service: General;  Laterality: Right;  RIGHT BREAST BIOPSY WITH NEEDLE LOCALIZATION   BREAST BIOPSY Left    No noticable scars anymore    BREAST EXCISIONAL BIOPSY Right 2017   BREAST EXCISIONAL BIOPSY Left over 10 years   no visible scar   CHOLECYSTECTOMY     FRACTURE SURGERY     RADIOACTIVE SEED GUIDED EXCISIONAL BREAST BIOPSY Right 06/12/2016   Procedure: RIGHT RADIOACTIVE SEED GUIDED EXCISIONAL BREAST BIOPSY X 2;  Surgeon: Rolm Bookbinder, MD;  Location: [facility];  Service: General;  Laterality: Right;  RIGHT RADIOACTIVE SEED GUIDED EXCISIONAL BREAST BIOPSY X 2   TONSILLECTOMY      Current Outpatient Medications  Medication Sig Dispense Refill   ALPRAZolam (XANAX) 0.25 MG tablet Take 1 tablet (0.25 mg total) by mouth at bedtime as needed for anxiety. 30 tablet 0   aspirin 81 MG chewable tablet Chew 81 mg by mouth daily.     atorvastatin (LIPITOR) 20 MG tablet Take 20 mg by mouth daily.     Cholecalciferol (VITAMIN D3) 3000 units TABS Take 3,000 Units by mouth 3 (three) times a week.      dicyclomine (BENTYL) 20 MG tablet Take 20 mg by mouth 3 (three) times daily.     diltiazem (CARDIZEM CD) 120 MG 24 hr capsule Take 1 capsule (120 mg total) by mouth at bedtime. 90 capsule 2   ergocalciferol (VITAMIN D2) 50000 UNITS capsule Take 50,000 Units by mouth 2 (two) times a week. Sunday and thursday     glipiZIDE (GLUCOTROL) 5 MG tablet Take by mouth daily before breakfast.     glucosamine-chondroitin 500-400 MG tablet Take 1 tablet by  mouth 2 (two) times daily.      levothyroxine (SYNTHROID) 25 MCG tablet Take 25 mcg by mouth every morning.     Multiple Vitamin (MULTIVITAMIN WITH MINERALS) TABS tablet Take 1 tablet by mouth daily.     omeprazole (PRILOSEC) 10 MG capsule Take 10 mg by mouth daily.     propranolol (INDERAL) 20 MG tablet Take 1 tablet (20 mg total) by mouth 2 (two) times daily. 180 tablet 2   sertraline (ZOLOFT) 25 MG tablet Take 1 tablet (25 mg total) by mouth daily.     No current facility-administered medications for this visit.    Allergies as of 07/05/2021 - Review Complete 07/05/2021  Allergen Reaction Noted   Erythromycin Nausea Only 12/22/2012   Lodine [etodolac] Hives 12/22/2012   Metoprolol  11/15/2019    Family History  Problem Relation Age of Onset   CAD Father    Diabetes type II Brother    Cancer Other    Hypertension Other    COPD Other    Hyperlipidemia Other    Diabetes Other    Stroke Other    Heart disease Other    Dementia Other    Breast cancer Maternal Grandmother    Alzheimer's disease  Maternal Grandfather    Lymphoma Paternal Grandmother    Heart disease Paternal Grandfather      Review of Systems: 12 system ROS is negative except as noted above with allergies, anxiety, fatigue, headaches, sleeping problems, swelling of feet.   Physical Exam: General:   Alert,  well-nourished, pleasant and cooperative in NAD Head:  Normocephalic and atraumatic. Eyes:  Sclera clear, no icterus.   Conjunctiva pink. Ears:  Normal auditory acuity. Nose:  No deformity, discharge,  or lesions. Mouth:  No deformity or lesions.   Neck:  Supple; no masses or thyromegaly. Lungs:  Clear throughout to auscultation.   No wheezes. Heart:  Regular rate and rhythm; no murmurs. Abdomen:  Soft, nontender, nondistended, normal bowel sounds, no rebound or guarding. No hepatosplenomegaly.   Rectal:  Deferred  Msk:  Symmetrical. No boney deformities LAD: No inguinal or umbilical LAD Extremities:   No clubbing or edema. Neurologic:  Alert and  oriented x4;  grossly nonfocal Skin:  Intact without significant lesions or rashes. Psych:  Alert and cooperative. Normal mood and affect.     Moyses Pavey L. Tarri Glenn, MD, MPH 07/05/2021, 9:06 AM

## 2021-07-05 NOTE — Patient Instructions (Addendum)
It was my pleasure to provide care to you today. Based on our discussion, I am providing you with my recommendations below:  RECOMMENDATION(S):   Avoid milk products, raw fruits, raw vegetables, high fat foods, artificial sweeteners, and carbonated beverages until your symptoms resolve. Please continue your Dicyclomine I am recommending Zofran to take when when completing your prep. Please take 1 tablet 30 minutes prior to each dose of your prep  RECORDS:  We have asked you to sign a Release of Information today to obtain records from Dr's [place] and [place].   COLONOSCOPY AND ENDOSCOPY:   You have been scheduled for an endoscopy and a colonoscopy. Please follow the written instructions given to you at your visit today.  PREP:   Please pick up your prep supplies at the pharmacy within the next 1-3 days.  INHALERS:   If you use inhalers (even only as needed), please bring them with you on the day of your procedure.  MEDICATIONS TO HOLD:  Please refer to Prep Instructions about holding GLIPIZIDE.   COLONOSCOPY TIPS:  To reduce nausea and dehydration, stay well hydrated for 3-4 days prior to the exam.  To prevent skin/hemorrhoid irritation - prior to wiping, put A&Dointment or vaseline on the toilet paper. Keep a towel or pad on the bed.  BEFORE STARTING YOUR PREP, drink  64oz of clear liquids in the morning. This will help to flush the colon and will ensure you are well hydrated!!!!  NOTE - This is in addition to the fluids required for to complete your prep. Use of a flavored hard candy, such as grape Anise Salvo, can counteract some of the flavor of the prep and may prevent some nausea.   LABS:   Please proceed to the basement level for lab work before leaving today. Press "B" on the elevator. The lab is located at the first door on the left as you exit the elevator.  HEALTHCARE LAWS AND MY CHART RESULTS:   Due to recent changes in healthcare laws, you may see results of your  imaging and/or laboratory studies on MyChart before I have had a chance to review them.  I understand that in some cases there may be results that are confusing or concerning to you. Please understand that not all results are received at same time and often I may need to interpret multiple results in order to provide you with the best plan of care or course of treatment. Therefore, I ask that you please give me 48 hours to thoroughly review all your results before contacting my office for clarification.   FOLLOW UP:  After your procedure, you will receive a call from my office staff regarding my recommendation for follow up.  BMI:  If you are age 67 or older, your body mass index should be between 23-30. Your There is no height or weight on file to calculate BMI. If this is out of the aforementioned range listed, please consider follow up with your Primary Care Provider.  MY CHART:  The [facility] providers would like to encourage you to use [facility] to communicate with providers for non-urgent requests or questions.  Due to long hold times on the telephone, sending your provider a message by [facility] may be a faster and more efficient way to get a response.  Please allow 48 business hours for a response.  Please remember that this is for non-urgent requests.   Thank you for trusting me with your gastrointestinal care!    Joelene Millin  Tarri Glenn, MD, MPH

## 2021-07-13 ENCOUNTER — Other Ambulatory Visit: Payer: Managed Care, Other (non HMO)

## 2021-07-16 ENCOUNTER — Other Ambulatory Visit: Payer: Managed Care, Other (non HMO)

## 2021-07-16 DIAGNOSIS — K529 Noninfective gastroenteritis and colitis, unspecified: Secondary | ICD-10-CM

## 2021-07-16 DIAGNOSIS — R1084 Generalized abdominal pain: Secondary | ICD-10-CM

## 2021-07-16 DIAGNOSIS — R110 Nausea: Secondary | ICD-10-CM

## 2021-07-16 DIAGNOSIS — K219 Gastro-esophageal reflux disease without esophagitis: Secondary | ICD-10-CM

## 2021-07-17 LAB — GIARDIA ANTIGEN
MICRO NUMBER:: 12213472
RESULT:: NOT DETECTED
SPECIMEN QUALITY:: ADEQUATE

## 2021-07-19 ENCOUNTER — Ambulatory Visit (AMBULATORY_SURGERY_CENTER): Payer: Managed Care, Other (non HMO) | Admitting: Gastroenterology

## 2021-07-19 ENCOUNTER — Other Ambulatory Visit: Payer: Self-pay

## 2021-07-19 ENCOUNTER — Encounter: Payer: Self-pay | Admitting: Gastroenterology

## 2021-07-19 VITALS — BP 125/82 | HR 87 | Temp 97.8°F | Resp 16 | Ht 65.0 in | Wt 230.0 lb

## 2021-07-19 DIAGNOSIS — K635 Polyp of colon: Secondary | ICD-10-CM

## 2021-07-19 DIAGNOSIS — D123 Benign neoplasm of transverse colon: Secondary | ICD-10-CM

## 2021-07-19 DIAGNOSIS — K529 Noninfective gastroenteritis and colitis, unspecified: Secondary | ICD-10-CM

## 2021-07-19 DIAGNOSIS — D126 Benign neoplasm of colon, unspecified: Secondary | ICD-10-CM | POA: Diagnosis not present

## 2021-07-19 LAB — CALPROTECTIN, FECAL: Calprotectin, Fecal: 166 ug/g — ABNORMAL HIGH (ref 0–120)

## 2021-07-19 MED ORDER — SODIUM CHLORIDE 0.9 % IV SOLN: 500.0000 mL | Freq: Once | INTRAVENOUS | Status: AC

## 2021-07-19 NOTE — Progress Notes (Signed)
pt tolerated well. VSS. awake and to recovery. Report given to RN.  

## 2021-07-19 NOTE — Progress Notes (Signed)
Presents for colonoscopy to evaluate chronic diarrhea. Her insurance denied coverage for concurrent EGD. See outpatient consultation note from 07/05/21 for complete details.   The various methods of treatment have been discussed with the patient and family.  The patient's history has been reviewed, patient examined, no change in status, stable for colonoscopy.  I have reviewed the patient's chart and labs.  Questions were answered to the patient's satisfaction.   After consideration of risks, benefits and other options for treatment, the patient has consented to  Procedure(s): COLONOSCOPY WITH PROPOFOL (N/A).

## 2021-07-19 NOTE — Progress Notes (Signed)
Called to room to assist during endoscopic procedure.  Patient ID and intended procedure confirmed with present staff. Received instructions for my participation in the procedure from the performing physician.  

## 2021-07-19 NOTE — Progress Notes (Signed)
Pt's states no medical or surgical changes since previsit or office visit. 

## 2021-07-19 NOTE — Progress Notes (Signed)
Reported sore throat following endoscopy. No documented aspiration during the procedure. However, she did have hiccups. Feeling well enough for discharge. Asked to call the MD on call tonight with any concerns. I updated Dr. Candis Schatz, our on call doctor.

## 2021-07-19 NOTE — Progress Notes (Signed)
PT arrived to recovery with productive cough and sats were 88 on RA. Placed on 2-3 liters for 10- 15 minutes. Coughing did subside and she was able to be weaned off the Oxygen to RA. Sats 93% when she was D/Cd

## 2021-07-19 NOTE — Patient Instructions (Addendum)
Thank you for letting us take care of your healthcare needs today. Please see handouts given to you on Polyps.    YOU HAD AN ENDOSCOPIC PROCEDURE TODAY AT [facility]:   Refer to the procedure report that was given to you for any specific questions about what was found during the examination.  If the procedure report does not answer your questions, please call your gastroenterologist to clarify.  If you requested that your care partner not be given the details of your procedure findings, then the procedure report has been included in a sealed envelope for you to review at your convenience later.  YOU SHOULD EXPECT: Some feelings of bloating in the abdomen. Passage of more gas than usual.  Walking can help get rid of the air that was put into your GI tract during the procedure and reduce the bloating. If you had a lower endoscopy (such as a colonoscopy or flexible sigmoidoscopy) you may notice spotting of blood in your stool or on the toilet paper. If you underwent a bowel prep for your procedure, you may not have a normal bowel movement for a few days.  Please Note:  You might notice some irritation and congestion in your nose or some drainage.  This is from the oxygen used during your procedure.  There is no need for concern and it should clear up in a day or so.  SYMPTOMS TO REPORT IMMEDIATELY:  Following lower endoscopy (colonoscopy or flexible sigmoidoscopy):  Excessive amounts of blood in the stool  Significant tenderness or worsening of abdominal pains  Swelling of the abdomen that is new, acute  Fever of 100F or higher  Following upper endoscopy (EGD)  Vomiting of blood or coffee ground material  New chest pain or pain under the shoulder blades  Painful or persistently difficult swallowing  New shortness of breath  Fever of 100F or higher  Black, tarry-looking stools  For urgent or emergent issues, a gastroenterologist can be reached at any hour by calling [phone]  [phone]. Do not use MyChart messaging for urgent concerns.    DIET:  We do recommend a small meal at first, but then you may proceed to your regular diet.  Drink plenty of fluids but you should avoid alcoholic beverages for 24 hours.  ACTIVITY:  You should plan to take it easy for the rest of today and you should NOT DRIVE or use heavy machinery until tomorrow (because of the sedation medicines used during the test).    FOLLOW UP: Our staff will call the number listed on your records 48-72 hours following your procedure to check on you and address any questions or concerns that you may have regarding the information given to you following your procedure. If we do not reach you, we will leave a message.  We will attempt to reach you two times.  During this call, we will ask if you have developed any symptoms of COVID 19. If you develop any symptoms (ie: fever, flu-like symptoms, shortness of breath, cough etc.) before then, please call [phone].  If you test positive for Covid 19 in the 2 weeks post procedure, please call and report this information to [place].    If any biopsies were taken you will be contacted by phone or by letter within the next 1-3 weeks.  Please call us at [phone] if you have not heard about the biopsies in 3 weeks.    SIGNATURES/CONFIDENTIALITY: You and/or your care partner have signed paperwork which  will be entered into your electronic medical record.  These signatures attest to the fact that that the information above on your After Visit Summary has been reviewed and is understood.  Full responsibility of the confidentiality of this discharge information lies with you and/or your care-partner.

## 2021-07-19 NOTE — Op Note (Signed)
[facility] Patient Name: Rachel Nixon Procedure Date: 07/19/2021 3:29 PM MRN: [id number] Endoscopist: Thornton Park MD, MD Age: 65 Referring MD:  Date of Birth: 01-18-1956 Gender: Female Account #: [id number] Procedure:                Colonoscopy Indications:              Chronic diarrhea                           Prior colonoscopy with Dr. Watt Climes in 2012 (results                            not available) Medicines:                Monitored Anesthesia Care Procedure:                Pre-Anesthesia Assessment:                           - Prior to the procedure, a History and Physical                            was performed, and patient medications and                            allergies were reviewed. The patient's tolerance of                            previous anesthesia was also reviewed. The risks                            and benefits of the procedure and the sedation                            options and risks were discussed with the patient.                            All questions were answered, and informed consent                            was obtained. Prior Anticoagulants: The patient has                            taken no previous anticoagulant or antiplatelet                            agents. ASA Grade Assessment: III - A patient with                            severe systemic disease. After reviewing the risks                            and benefits, the patient was deemed in  satisfactory condition to undergo the procedure.                           After obtaining informed consent, the colonoscope                            was passed under direct vision. Throughout the                            procedure, the patient's blood pressure, pulse, and                            oxygen saturations were monitored continuously. The                            CF [id number] was introduced through the anus                             and advanced to the 4 cm into the ileum. A second                            forward view of the right colon was performed. The                            colonoscopy was performed with moderate difficulty                            due to a redundant colon, significant looping and a                            tortuous colon. Successful completion of the                            procedure was aided by changing the patient's                            position, withdrawing and reinserting the scope and                            applying abdominal pressure. The patient tolerated                            the procedure well. The quality of the bowel                            preparation was good. The terminal ileum, ileocecal                            valve, appendiceal orifice, and rectum were                            photographed. Scope In: 3:48:40 PM Scope Out: 4:08:59 PM Scope Withdrawal Time: 0 hours 11 minutes 20 seconds  Total Procedure Duration: 0 hours 20 minutes 19 seconds  Findings:                 The perianal and digital rectal examinations were                            normal.                           The colon (entire examined portion) was moderately                            tortuous.                           A 10 mm polyp was found in the transverse colon.                            The polyp was flat. The polyp was removed with a                            cold snare in a piecemeal fashion. Resection and                            retrieval were complete. Estimated blood loss was                            minimal.                           A 3 mm polyp was found in the hepatic flexure. The                            polyp was sessile. The polyp was removed with a                            cold snare. Resection and retrieval were complete.                            Estimated blood loss was minimal.                           A 5 mm polyp was found in  the proximal transverse                            colon. The polyp was sessile. The polyp was removed                            with a cold snare. Resection and retrieval were                            complete. Estimated blood loss was minimal.                           A  5 mm polyp was found in the splenic flexure. The                            polyp was sessile. The polyp was removed with a                            cold snare. Resection and retrieval were complete.                            Estimated blood loss was minimal.                           The remainder of the examined colonic mucosa was                            otherwsie normal. Biopsies for histology were taken                            with a cold forceps from the right colon and left                            colon for evaluation of microscopic colitis.                            Estimated blood loss was minimal.                           The exam was otherwise without abnormality on                            direct and retroflexion views. Complications:            No immediate complications. Estimated blood loss:                            Minimal. Estimated Blood Loss:     Estimated blood loss was minimal. Impression:               - Tortuous colon.                           - One 10 mm polyp in the transverse colon, removed                            with a cold snare. Resected and retrieved.                           - One 3 mm polyp at the hepatic flexure, removed                            with a cold snare. Resected and retrieved.                           - One 5 mm polyp in the proximal transverse colon,  removed with a cold snare. Resected and retrieved.                           - One 5 mm polyp at the splenic flexure, removed                            with a cold snare. Resected and retrieved.                           - The entire examined colon is normal. Biopsied.                            - The examination was otherwise normal on direct                            and retroflexion views. Recommendation:           - Patient has a contact number available for                            emergencies. The signs and symptoms of potential                            delayed complications were discussed with the                            patient. Return to normal activities tomorrow.                            Written discharge instructions were provided to the                            patient.                           - Resume previous diet.                           - Continue present medications.                           - Await pathology results.                           - Repeat colonoscopy in 3 years for surveillance if                            at least 3 polyps are adenomas.                           - If colon biopsies are normal, plan celiac panel                            as next step in the evaluation of chronic diarrhea  if this was not previously performed.                           - Emerging evidence supports eating a diet of                            fruits, vegetables, grains, calcium, and yogurt                            while reducing red meat and alcohol may reduce the                            risk of colon cancer.                           - Return to GI office for further care. Thornton Park MD, MD 07/19/2021 4:20:48 PM This report has been signed electronically.

## 2021-07-20 ENCOUNTER — Telehealth: Payer: Self-pay | Admitting: Gastroenterology

## 2021-07-20 ENCOUNTER — Other Ambulatory Visit: Payer: Self-pay | Admitting: Family Medicine

## 2021-07-20 DIAGNOSIS — E2839 Other primary ovarian failure: Secondary | ICD-10-CM

## 2021-07-20 NOTE — Telephone Encounter (Signed)
Called patient to follow-up on her symptoms after endoscopy yesterday. Dysphonia and cough have improved. She had a headache that resolved today. No ongoing symptoms at the time of my call. She noted appreciation for my checking up on her.

## 2021-07-23 ENCOUNTER — Telehealth: Payer: Self-pay | Admitting: *Deleted

## 2021-07-23 NOTE — Telephone Encounter (Signed)
  Follow up Call-  Call back number 07/19/2021  Post procedure Call Back phone  # [phone]  Permission to leave phone message Yes  Some recent data might be hidden     Patient questions:  Do you have a fever, pain , or abdominal swelling? No. Pain Score  0 *  Have you tolerated food without any problems? Yes.    Have you been able to return to your normal activities? Yes.    Do you have any questions about your discharge instructions: Diet   No. Medications  No. Follow up visit  No.  Do you have questions or concerns about your Care? No.  Actions: * If pain score is 4 or above: No action needed, pain <4.  Have you developed a fever since your procedure? no  2.   Have you had an respiratory symptoms (SOB or cough) since your procedure? no  3.   Have you tested positive for COVID 19 since your procedure no  4.   Have you had any family members/close contacts diagnosed with the COVID 19 since your procedure?  no   If yes to any of these questions please route to Joylene John, RN and Joella Prince, RN

## 2021-07-24 ENCOUNTER — Other Ambulatory Visit: Payer: Self-pay | Admitting: Family Medicine

## 2021-07-24 DIAGNOSIS — E2839 Other primary ovarian failure: Secondary | ICD-10-CM

## 2021-07-25 ENCOUNTER — Other Ambulatory Visit: Payer: Managed Care, Other (non HMO)

## 2021-07-30 ENCOUNTER — Encounter: Payer: Self-pay | Admitting: Gastroenterology

## 2021-07-30 ENCOUNTER — Other Ambulatory Visit: Payer: Self-pay

## 2021-07-30 DIAGNOSIS — K529 Noninfective gastroenteritis and colitis, unspecified: Secondary | ICD-10-CM

## 2021-07-31 ENCOUNTER — Telehealth: Payer: Self-pay

## 2021-07-31 ENCOUNTER — Other Ambulatory Visit: Payer: Self-pay

## 2021-08-09 ENCOUNTER — Other Ambulatory Visit: Payer: Managed Care, Other (non HMO)

## 2021-08-17 ENCOUNTER — Other Ambulatory Visit: Payer: Self-pay | Admitting: Adult Health

## 2021-09-04 ENCOUNTER — Other Ambulatory Visit: Payer: Managed Care, Other (non HMO)

## 2021-09-04 ENCOUNTER — Ambulatory Visit: Payer: Managed Care, Other (non HMO) | Admitting: Gastroenterology

## 2021-09-04 ENCOUNTER — Encounter: Payer: Self-pay | Admitting: Gastroenterology

## 2021-09-04 VITALS — BP 129/77 | HR 76 | Ht 63.0 in | Wt 226.4 lb

## 2021-09-04 DIAGNOSIS — K529 Noninfective gastroenteritis and colitis, unspecified: Secondary | ICD-10-CM | POA: Diagnosis not present

## 2021-09-04 NOTE — Patient Instructions (Addendum)
It was my pleasure to provide care to you today. Based on our discussion, I am providing you with my recommendations below:  RECOMMENDATION(S):   Please stop in the lab today for testing for celiac.  I recommend that you take dicyclomine prior to meals. Please call or send me a MyChart message after one week with a symptom update. If you aren't feeling better, we will pursue additional testing.  I recommend that you avoid dairy products, raw vegetables, high fat foods, artificial sweeteners, or carbonated beverages. These can all worsen your diarrea.   LABS:   Please proceed to the basement level for lab work before leaving today. Press "B" on the elevator. The lab is located at the first door on the left as you exit the elevator.  HEALTHCARE LAWS AND MY CHART RESULTS:   Due to recent changes in healthcare laws, you may see results of your imaging and/or laboratory studies on MyChart before I have had a chance to review them.  I understand that in some cases there may be results that are confusing or concerning to you. Please understand that not all results are received at same time and often I may need to interpret multiple results in order to provide you with the best plan of care or course of treatment. Therefore, I ask that you please give me 48 hours to thoroughly review all your results before contacting my office for clarification.    FOLLOW UP:  I would like for you to follow up with me by MyChart in one week.  BMI:  If you are age 62 or older, your body mass index should be between 23-30. Your Body mass index is 40.1 kg/m. If this is out of the aforementioned range listed, please consider follow up with your Primary Care Provider.   MY CHART:  The [facility] providers would like to encourage you to use [facility] to communicate with providers for non-urgent requests or questions.  Due to long hold times on the telephone, sending your provider a message by [facility] may be a faster  and more efficient way to get a response.  Please allow 48 business hours for a response.  Please remember that this is for non-urgent requests.   Thank you for trusting me with your gastrointestinal care!    Thornton Park, MD, MPH

## 2021-09-04 NOTE — Progress Notes (Signed)
Referring Provider: Maurice Small, MD Primary Care Physician:  Maurice Small, MD  Chief complaint: Chronic diarrhea   IMPRESSION:  Chronic diarrhea Elevated fecal calprotectin at 166 History of IBS with intermittently altered bowel habits History of colon polyps    - 3 tubular adenomas removed on colonoscopy 07/19/21    - surveillance colonoscopy 2025 Severe nausea with prior bowel prep   PLAN: - TTGA and IgA to evaluate for celiac - Discussed the use of dicyclomine trial taken prior to meals - She will send a MyChart message in one week with a symptoms update - Colestipol 1 g QAM if symptoms do not improve over the next week - Low threshold to repeat fecal calprotectin +/- evaluation of small bowel - Avoid milk products, raw fruits, raw vegetables, high fat foods, artifical sweeteners, and carbonated beverages until symptoms resolve   Please see the "Patient Instructions" section for addition details about the plan.  HPI: Rachel Nixon is a 65 y.o. female initially referred for chronic diarrhea.  The interval history is obtained through the patient and review of her electronic health record.  She has IBS, reflux, obesity, type 2 diabetes, hypercholesterolemia, migraines, hypertension, vitamin D deficiency, depression, and chronic fatigue.  Intermittent explosive watery diarrhea over the last year. Initially developed when she started using metformin. Having 1-4 BM daily without blood or mucous. May have some associated abdominal pain. Requires Imodium to leave the house and has been wearing undergarments. Has had accidents. No nocturnal symptoms.  Not related to eating. She was diagnosed with IBS years ago and intermittently had diarrhea associated with that but those symptoms were not nearly as severe. No change in symptoms despite discontinuation of metformin.   Drinks a Diet Coke every afternoon. Uses Stevia. No identified food triggers. Appetite is good. Weight is stable.   No  improvement with PeptoBismal.  Labs 12/18/20: WBC 8.1, hgb 12.8, platelets 260, normal differential; normal CMP except glucose 129 and albumin 3.4  Labs 07/16/21: Fecal calprotectin 166, giardia negative  Returns in follow-up after endoscopic evaluation with colonoscopy 07/19/21. Three tubular adenomas, hyperplastic polyps, normal right and left sided colon biopsies.    Prior endoscopic history: - ? Colonoscopy with Dr. Collene Mares in 2009 - Colonoscopy in 2012 for retrieval of a crown. Performed by a Psychologist, sport and exercise. No polyps seen. Colon noted to be tortuous.  - Colonoscopy with Dr. Watt Climes since then. Likely 5-10 years ago. Severe nausea with the prep.  - Reports a prior EGD with Dr. Watt Climes  Reviewed 17 pages of records from Dr. Jana Hakim at [facility] [place].  Last seen 2014 for 10 years of IBS symptoms.  Predominant constipation with spells of diarrhea with significant urgency, pain, nausea, and sweats.  Sometimes triggered by greasy or fatty foods. Colonoscopy 03/2002 EGD 02/03/2013 for reflux: Medium hiatal hernia, otherwise normal Screening colonoscopy colonoscopy 02/05/2013: Diminutive hyperplastic polyp, benign polypoid mucosa, internal and external hemorrhoids  No known family history of colon cancer or polyps. No family history of uterine/endometrial cancer, pancreatic cancer or gastric/stomach cancer.   Past Medical History:  Diagnosis Date   Allergy    Anxiety    Arthritis    hips, knees, hands   Depression    Diabetes mellitus without complication (HCC)    GERD (gastroesophageal reflux disease)    Hyperlipidemia    Hypertension    Hypothyroidism    Inappropriate sinus tachycardia 07/29/2016   Migraine headache    Obesity     Past Surgical History:  Procedure Laterality Date  ABDOMINAL HYSTERECTOMY     BREAST BIOPSY Right 06/12/2016   Procedure: RIGHT BREAST BIOPSY WITH NEEDLE LOCALIZATION;  Surgeon: Rolm Bookbinder, MD;  Location: [facility];  Service: General;  Laterality:  Right;  RIGHT BREAST BIOPSY WITH NEEDLE LOCALIZATION   BREAST BIOPSY Left    No noticable scars anymore    BREAST EXCISIONAL BIOPSY Right 2017   BREAST EXCISIONAL BIOPSY Left over 10 years   no visible scar   CHOLECYSTECTOMY     FRACTURE SURGERY     RADIOACTIVE SEED GUIDED EXCISIONAL BREAST BIOPSY Right 06/12/2016   Procedure: RIGHT RADIOACTIVE SEED GUIDED EXCISIONAL BREAST BIOPSY X 2;  Surgeon: Rolm Bookbinder, MD;  Location: [facility];  Service: General;  Laterality: Right;  RIGHT RADIOACTIVE SEED GUIDED EXCISIONAL BREAST BIOPSY X 2   TONSILLECTOMY      Current Outpatient Medications  Medication Sig Dispense Refill   ALPRAZolam (XANAX) 0.25 MG tablet Take 1 tablet (0.25 mg total) by mouth at bedtime as needed for anxiety. 30 tablet 0   atorvastatin (LIPITOR) 20 MG tablet Take 20 mg by mouth daily.     Cholecalciferol (VITAMIN D3) 3000 units TABS Take 3,000 Units by mouth 3 (three) times a week.      dicyclomine (BENTYL) 20 MG tablet Take 20 mg by mouth 3 (three) times daily.     diltiazem (CARDIZEM CD) 120 MG 24 hr capsule Take 1 capsule (120 mg total) by mouth at bedtime. 90 capsule 2   ergocalciferol (VITAMIN D2) 50000 UNITS capsule Take 50,000 Units by mouth 2 (two) times a week. Sunday and thursday     glipiZIDE (GLUCOTROL) 5 MG tablet Take by mouth daily before breakfast.     levothyroxine (SYNTHROID) 25 MCG tablet Take 25 mcg by mouth every morning.     Multiple Vitamin (MULTIVITAMIN WITH MINERALS) TABS tablet Take 1 tablet by mouth daily.     omeprazole (PRILOSEC) 10 MG capsule Take 10 mg by mouth daily.     ondansetron (ZOFRAN) 4 MG tablet Please take as directed when completing colonoscopy prep 10 tablet 1   propranolol (INDERAL) 20 MG tablet TAKE 1 TABLET BY MOUTH TWICE A DAY 180 tablet 2   sertraline (ZOLOFT) 25 MG tablet Take 1 tablet (25 mg total) by mouth daily.     Current Facility-Administered Medications  Medication Dose Route Frequency Provider Last  Rate Last Admin   0.9 %  sodium chloride infusion  500 mL Intravenous Once Thornton Park, MD        Allergies as of 09/04/2021 - Review Complete 09/04/2021  Allergen Reaction Noted   Erythromycin Nausea Only 12/22/2012   Lodine [etodolac] Hives 12/22/2012   Metoprolol  11/15/2019    Family History  Problem Relation Age of Onset   CAD Father    Diabetes type II Brother    Cancer Other    Hypertension Other    COPD Other    Hyperlipidemia Other    Diabetes Other    Stroke Other    Heart disease Other    Dementia Other    Breast cancer Maternal Grandmother    Alzheimer's disease Maternal Grandfather    Lymphoma Paternal Grandmother    Heart disease Paternal Grandfather       Physical Exam: General:   Alert,  well-nourished, pleasant and cooperative in NAD Head:  Normocephalic and atraumatic. Eyes:  Sclera clear, no icterus.   Conjunctiva pink. Neurologic:  Alert and  oriented x4;  grossly nonfocal Skin:  Intact  without significant lesions or rashes. Psych:  Alert and cooperative. Normal mood and affect.     Vandell Kun L. Tarri Glenn, MD, MPH 09/04/2021, 10:29 AM

## 2021-09-05 LAB — TISSUE TRANSGLUTAMINASE, IGA: (tTG) Ab, IgA: <1 U/mL

## 2021-09-05 LAB — IGA: Immunoglobulin A: 279 mg/dL (ref 70–320)

## 2021-09-17 ENCOUNTER — Other Ambulatory Visit: Payer: Self-pay

## 2021-09-17 DIAGNOSIS — R1084 Generalized abdominal pain: Secondary | ICD-10-CM

## 2021-09-17 DIAGNOSIS — K529 Noninfective gastroenteritis and colitis, unspecified: Secondary | ICD-10-CM

## 2021-09-17 MED ORDER — COLESTIPOL HCL 1 G PO TABS: 1.0000 g | ORAL_TABLET | Freq: Every morning | ORAL | 2 refills | Status: DC

## 2021-09-20 ENCOUNTER — Encounter (HOSPITAL_BASED_OUTPATIENT_CLINIC_OR_DEPARTMENT_OTHER): Payer: Self-pay

## 2021-10-11 ENCOUNTER — Other Ambulatory Visit: Payer: Self-pay | Admitting: Cardiovascular Disease

## 2021-12-04 ENCOUNTER — Other Ambulatory Visit: Payer: Self-pay | Admitting: Gastroenterology

## 2021-12-04 DIAGNOSIS — K529 Noninfective gastroenteritis and colitis, unspecified: Secondary | ICD-10-CM

## 2022-01-08 ENCOUNTER — Other Ambulatory Visit: Payer: Self-pay | Admitting: Cardiovascular Disease

## 2022-01-10 ENCOUNTER — Other Ambulatory Visit: Payer: Managed Care, Other (non HMO)

## 2022-01-16 ENCOUNTER — Telehealth: Payer: Self-pay | Admitting: Cardiovascular Disease

## 2022-01-16 NOTE — Telephone Encounter (Signed)
°*  STAT* If patient is at the pharmacy, call can be transferred to refill team.   1. Which medications need to be refilled? (please list name of each medication and dose if known) diltiazem (CARDIZEM CD) 120 MG 24 hr capsule  2. Which pharmacy/location (including street and city if local pharmacy) is medication to be sent to?  [id number] - [place], [place] - [address] AT [address] Phone:  [phone]         3. Do they need a 30 day or 90 day supply? 90 day

## 2022-01-18 MED ORDER — DILTIAZEM HCL ER COATED BEADS 120 MG PO CP24: 120.0000 mg | ORAL_CAPSULE | Freq: Every day | ORAL | 0 refills | Status: DC

## 2022-01-18 NOTE — Telephone Encounter (Signed)
Rx request sent to pharmacy. Sent 30 day supply with 0 refills to make it to appt with Rachel Montana, NP on 02/11/22. Added not that this appt must be kept for 90 day refills.

## 2022-01-25 ENCOUNTER — Other Ambulatory Visit: Payer: Self-pay | Admitting: Cardiovascular Disease

## 2022-02-11 ENCOUNTER — Encounter (HOSPITAL_BASED_OUTPATIENT_CLINIC_OR_DEPARTMENT_OTHER): Payer: Self-pay | Admitting: Family

## 2022-02-11 ENCOUNTER — Ambulatory Visit (INDEPENDENT_AMBULATORY_CARE_PROVIDER_SITE_OTHER): Payer: Medicare Other | Admitting: Family

## 2022-02-11 ENCOUNTER — Other Ambulatory Visit: Payer: Self-pay

## 2022-02-11 VITALS — BP 124/76 | HR 94 | Ht 63.0 in | Wt 225.5 lb

## 2022-02-11 DIAGNOSIS — E663 Overweight: Secondary | ICD-10-CM | POA: Diagnosis not present

## 2022-02-11 DIAGNOSIS — R000 Tachycardia, unspecified: Secondary | ICD-10-CM

## 2022-02-11 DIAGNOSIS — I10 Essential (primary) hypertension: Secondary | ICD-10-CM

## 2022-02-11 MED ORDER — DILTIAZEM HCL ER COATED BEADS 120 MG PO CP24: 120.0000 mg | ORAL_CAPSULE | Freq: Every day | ORAL | 3 refills | Status: DC

## 2022-02-11 MED ORDER — PROPRANOLOL HCL 20 MG PO TABS: 20.0000 mg | ORAL_TABLET | Freq: Two times a day (BID) | ORAL | 3 refills | Status: DC

## 2022-02-11 NOTE — Progress Notes (Signed)
? ?Office Visit  ?  ?Patient Name: Rachel Nixon ?Date of Encounter: 02/11/2022 ? ?PCP:  Glenis Smoker, MD ?  ?[place]  ?Cardiologist:  Skeet Latch, MD  ?Advanced Practice Provider:  No care team member to display ?Electrophysiologist:  None  ? ?Chief Complaint  ?  ?Rachel Nixon is a 66 y.o. female with a hx of hypertension, hyperlipidemia, atypical ductal hyperplasia, diabetes mellitus, GERD, obesity, inappropriate sinus tachycardia presents today for follow-up of hypertension ? ?Past Medical History  ?  ?Past Medical History:  ?Diagnosis Date  ? Allergy   ? Anxiety   ? Arthritis   ? hips, knees, hands  ? Depression   ? Diabetes mellitus without complication ([place])   ? GERD (gastroesophageal reflux disease)   ? Hyperlipidemia   ? Hypertension   ? Hypothyroidism   ? Inappropriate sinus tachycardia 07/29/2016  ? Migraine headache   ? Obesity   ? ?Past Surgical History:  ?Procedure Laterality Date  ? ABDOMINAL HYSTERECTOMY    ? BREAST BIOPSY Right 06/12/2016  ? Procedure: RIGHT BREAST BIOPSY WITH NEEDLE LOCALIZATION;  Surgeon: Rolm Bookbinder, MD;  Location: [facility];  Service: General;  Laterality: Right;  RIGHT BREAST BIOPSY WITH NEEDLE LOCALIZATION  ? BREAST BIOPSY Left   ? No noticable scars anymore   ? BREAST EXCISIONAL BIOPSY Right 2017  ? BREAST EXCISIONAL BIOPSY Left over 10 years  ? no visible scar  ? CHOLECYSTECTOMY    ? FRACTURE SURGERY    ? RADIOACTIVE SEED GUIDED EXCISIONAL BREAST BIOPSY Right 06/12/2016  ? Procedure: RIGHT RADIOACTIVE SEED GUIDED EXCISIONAL BREAST BIOPSY X 2;  Surgeon: Rolm Bookbinder, MD;  Location: [facility];  Service: General;  Laterality: Right;  RIGHT RADIOACTIVE SEED GUIDED EXCISIONAL BREAST BIOPSY X 2  ? TONSILLECTOMY    ? ? ?Allergies ? ?Allergies  ?Allergen Reactions  ? Erythromycin Nausea Only  ?  Other reaction(s): nausea & vomit  ? Etodolac Hives  ?  Other reaction(s): rash  ? Metoprolol    ? ? ?History of Present Illness  ?  ?Rachel Nixon is a 66 y.o. female with a hx of hypertension, hyperlipidemia, atypical ductal hyperplasia, diabetes mellitus, GERD, obesity, inappropriate sinus tachycardia last seen via telemedicine 12/28/19 by Jory Sims, NP. ? ?She wore a monitor 08/2019 revealing predominantly normal sinus rhythm with average heart rate 98 bpm and up to 5 beats of NSVT noted.  At follow-up 11/2019 she was started on propanolol.  At follow-up 12/2019 she noted improvement in symptoms. ? ?She presents today for follow up. Since last seen she has retired in December. 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 No formal exercise routine but interested in starting one. Reports no shortness of breath nor dyspnea on exertion. Reports no chest pain, pressure, or tightness. No edema, orthopnea, PND. Reports no palpitations.  Her BP has been well controlled by home monitoring.  ? ?EKGs/Labs/Other Studies Reviewed:  ? ?The following studies were reviewed today: ? ?Monitor 08/2019 ?Quality: Fair.  Baseline artifact. ?Predominant rhythm: sinus rhythm ?Average heart rate: 98 bpm ?Max heart rate: 148 bpm ?Min heart rate: 72 bpm ?  ?Up to 5 beats of SVT noted. ? ?EKG:  EKG is  ordered today.  The ekg ordered today demonstrates NSR 94 bpm with no acute ST/T wave changes. Poor R wave changes.  ? ?Recent Labs: ?No results found for requested labs within last 8760 hours.  ?  Recent Lipid Panel ?No results found for: CHOL, TRIG, HDL, CHOLHDL, VLDL, LDLCALC, LDLDIRECT ? ? ?Home Medications  ? ?Current Meds  ?Medication Sig  ? atorvastatin (LIPITOR) 20 MG tablet Take 20 mg by mouth daily.  ? Cholecalciferol (VITAMIN D3) 3000 units TABS Take 3,000 Units by mouth 3 (three) times a week.   ? colestipol (COLESTID) 1 g tablet TAKE 1 TABLET (1 G TOTAL) BY MOUTH EVERY MORNING.  ? Cyanocobalamin (VITAMIN B12) 1000 MCG TBCR Take 1,000 tablets by mouth 2 (two) times a week.  ?  diltiazem (CARDIZEM CD) 120 MG 24 hr capsule Take 1 capsule (120 mg total) by mouth at bedtime. Please keep your upcoming appointment with Laurann Montana, NP for refills.  ? ergocalciferol (VITAMIN D2) 50000 UNITS capsule Take 50,000 Units by mouth 2 (two) times a week. Sunday and thursday  ? glipiZIDE (GLUCOTROL) 5 MG tablet Take by mouth daily before breakfast.  ? levothyroxine (SYNTHROID) 50 MCG tablet Take 50 mcg by mouth daily.  ? Multiple Vitamin (MULTIVITAMIN WITH MINERALS) TABS tablet Take 1 tablet by mouth daily.  ? omeprazole (PRILOSEC) 10 MG capsule Take 10 mg by mouth daily.  ? ondansetron (ZOFRAN) 4 MG tablet Please take as directed when completing colonoscopy prep  ? propranolol (INDERAL) 20 MG tablet TAKE 1 TABLET BY MOUTH TWICE A DAY  ? sertraline (ZOLOFT) 50 MG tablet Take 50 mg by mouth daily.  ? ?Current Facility-Administered Medications for the 02/11/22 encounter (Office Visit) with Loel Dubonnet, NP  ?Medication  ? 0.9 %  sodium chloride infusion  ?  ? ?Review of Systems  ?    ?All other systems reviewed and are otherwise negative except as noted above. ? ?Physical Exam  ?  ?VS:  BP 124/76 (BP Location: Left Arm, Patient Position: Sitting, Cuff Size: Large)   Pulse 94   Ht '5\' 3"'$  (1.6 m)   Wt 225 lb 8 oz (102.3 kg)   SpO2 96%   BMI 39.95 kg/m?  , BMI Body mass index is 39.95 kg/m?. ? ?Wt Readings from Last 3 Encounters:  ?02/11/22 225 lb 8 oz (102.3 kg)  ?09/04/21 226 lb 6.4 oz (102.7 kg)  ?07/19/21 230 lb (104.3 kg)  ?  ?GEN: Well nourished, well developed, in no acute distress. ?HEENT: normal. ?Neck: Supple, no JVD, carotid bruits, or masses. ?Cardiac: RRR, no murmurs, rubs, or gallops. No clubbing, cyanosis, edema.  Radials/PT 2+ and equal bilaterally.  ?Respiratory:  Respirations regular and unlabored, clear to auscultation bilaterally. ?GI: Soft, nontender, nondistended. ?MS: No deformity or atrophy. ?Skin: Warm and dry, no rash. ?Neuro:  Strength and sensation are intact. ?Psych:  Normal affect. ? ?Assessment & Plan  ?  ?Inappropriate sinus tachycardia - Well controlled on Diltiazem '120mg'$  QD and Propranolol '20mg'$  QD. Refill provided.  ? ?Hypertension - BP well controlled. Continue current antihypertensive regimen.   ? ?Situational anxiety - Continue to follow with PCP.  ? ?GERD - Well controlled. Follows with GI and PCP.  ? ?Obesity - Weight loss via diet and exercise encouraged. Discussed the impact being overweight would have on cardiovascular risk. Interested in weight loss - referred to PREP exercise program at [facility].  ? ?Disposition: Follow up in 1 year(s) with Skeet Latch, MD or APP. ? ?Signed, ?Loel Dubonnet, NP ?02/11/2022, 4:22 PM ?[place] ?

## 2022-02-11 NOTE — Patient Instructions (Signed)
Medication Instructions:  ?Continue your current medications.  ? ?*If you need a refill on your cardiac medications before your next appointment, please call your pharmacy* ? ? ?Lab Work: ?None ordered today.  ? ?If you have labs (blood work) drawn today and your tests are completely normal, you will receive your results only by: ?MyChart Message (if you have MyChart) OR ?A paper copy in the mail ?If you have any lab test that is abnormal or we need to change your treatment, we will call you to review the results. ? ? ?Testing/Procedures: ?Your EKG today showed normal sinus rhythm.  ? ?Follow-Up: ?At [facility], you and your health needs are our priority.  As part of our continuing mission to provide you with exceptional heart care, we have created designated Provider Care Teams.  These Care Teams include your primary Cardiologist (physician) and Advanced Practice Providers (APPs -  Physician Assistants and Nurse Practitioners) who all work together to provide you with the care you need, when you need it. ? ?We recommend signing up for the patient portal called "MyChart".  Sign up information is provided on this After Visit Summary.  MyChart is used to connect with patients for Virtual Visits (Telemedicine).  Patients are able to view lab/test results, encounter notes, upcoming appointments, etc.  Non-urgent messages can be sent to your provider as well.   ?To learn more about what you can do with MyChart, go to [email or web address].   ? ?Your next appointment:   ?1 year(s) ? ?The format for your next appointment:   ?In Person ? ?Provider:   ?Skeet Latch, MD  ? ? ?Other Instructions ? ?Heart Healthy Diet Recommendations: ?A low-salt diet is recommended. Meats should be grilled, baked, or boiled. Avoid fried foods. Focus on lean protein sources like fish or chicken with vegetables and fruits. The American Heart Association is a Microbiologist!  American Heart Association Diet and Lifeystyle  Recommendations   ? ?Exercise recommendations: ?The American Heart Association recommends 150 minutes of moderate intensity exercise weekly. ?Try 30 minutes of moderate intensity exercise 4-5 times per week. ?This could include walking, jogging, or swimming. ?  ?

## 2022-02-12 ENCOUNTER — Encounter (HOSPITAL_BASED_OUTPATIENT_CLINIC_OR_DEPARTMENT_OTHER): Payer: Self-pay | Admitting: Family

## 2022-02-14 ENCOUNTER — Telehealth: Payer: Self-pay

## 2022-02-14 NOTE — Telephone Encounter (Signed)
Called to discuss PREP program, she wants to attend, prefers [facility]. Will contact her end of March/first of April with next T/Th 10am class once date is set for late April.  ?

## 2022-03-01 ENCOUNTER — Other Ambulatory Visit: Payer: Self-pay | Admitting: Gastroenterology

## 2022-03-01 DIAGNOSIS — K529 Noninfective gastroenteritis and colitis, unspecified: Secondary | ICD-10-CM

## 2022-03-05 ENCOUNTER — Other Ambulatory Visit: Payer: Self-pay | Admitting: Gastroenterology

## 2022-03-05 DIAGNOSIS — K529 Noninfective gastroenteritis and colitis, unspecified: Secondary | ICD-10-CM

## 2022-04-03 ENCOUNTER — Other Ambulatory Visit: Payer: Self-pay | Admitting: *Deleted

## 2022-04-03 DIAGNOSIS — N6091 Unspecified benign mammary dysplasia of right breast: Secondary | ICD-10-CM

## 2022-04-04 ENCOUNTER — Telehealth: Payer: Self-pay

## 2022-04-04 NOTE — Telephone Encounter (Signed)
Called to confirm participation in next PREP class at [facility] on May 9; left voicemail ?

## 2022-04-08 ENCOUNTER — Telehealth: Payer: Self-pay

## 2022-04-08 NOTE — Telephone Encounter (Signed)
Confirmed attendance for PREP class starting May 9, assessment visit scheduled for May 3 at 2:30 ?

## 2022-04-15 ENCOUNTER — Telehealth: Payer: Self-pay

## 2022-04-15 NOTE — Telephone Encounter (Signed)
Called to follow up scheduled PREP assessment visit today that she missed, left voicemail.  ?

## 2022-04-22 ENCOUNTER — Other Ambulatory Visit: Payer: Self-pay | Admitting: Adult Health

## 2022-04-22 ENCOUNTER — Ambulatory Visit
Admission: RE | Admit: 2022-04-22 | Discharge: 2022-04-22 | Disposition: A | Discharge status: Home / Self Care | Payer: Medicare Other | Source: Ambulatory Visit | Attending: Adult Health | Admitting: Adult Health

## 2022-04-22 DIAGNOSIS — N6091 Unspecified benign mammary dysplasia of right breast: Secondary | ICD-10-CM

## 2022-05-22 ENCOUNTER — Ambulatory Visit: Payer: Medicare Other | Admitting: Gastroenterology

## 2022-05-23 ENCOUNTER — Other Ambulatory Visit: Payer: Self-pay

## 2022-05-23 ENCOUNTER — Other Ambulatory Visit: Payer: Self-pay | Admitting: Family Medicine

## 2022-05-23 DIAGNOSIS — E2839 Other primary ovarian failure: Secondary | ICD-10-CM

## 2022-06-07 ENCOUNTER — Ambulatory Visit: Payer: Medicare Other | Admitting: Gastroenterology

## 2022-07-09 ENCOUNTER — Other Ambulatory Visit: Payer: Medicare Other

## 2022-08-14 ENCOUNTER — Other Ambulatory Visit: Payer: Medicare Other

## 2022-08-14 ENCOUNTER — Encounter: Payer: Self-pay | Admitting: Gastroenterology

## 2022-08-14 ENCOUNTER — Ambulatory Visit: Payer: Medicare Other | Admitting: Gastroenterology

## 2022-08-14 VITALS — BP 112/80 | HR 68 | Ht 63.0 in | Wt 230.1 lb

## 2022-08-14 DIAGNOSIS — R197 Diarrhea, unspecified: Secondary | ICD-10-CM | POA: Diagnosis not present

## 2022-08-14 DIAGNOSIS — K529 Noninfective gastroenteritis and colitis, unspecified: Secondary | ICD-10-CM | POA: Diagnosis not present

## 2022-08-14 DIAGNOSIS — R1030 Lower abdominal pain, unspecified: Secondary | ICD-10-CM | POA: Diagnosis not present

## 2022-08-14 MED ORDER — COLESTIPOL HCL 1 G PO TABS: 1.0000 g | ORAL_TABLET | Freq: Two times a day (BID) | ORAL | 3 refills | Status: DC

## 2022-08-14 NOTE — Progress Notes (Signed)
Referring Provider: Glenis Smoker, * Primary Care Physician:  Glenis Smoker, MD  Chief complaint: Chronic diarrhea   IMPRESSION:  Chronic diarrhea    - Fecal calprotectin elevated at 166 07/16/21    - Giardia negative    - colonoscopy normal including random colon biopsies negative    - normal TTGA and IgA 2022    - etiology remains unclear, differential is broad Elevated fecal calprotectin at 166 History of IBS with intermittently altered bowel habits History of colon polyps    - 3 tubular adenomas removed on colonoscopy 07/19/21    - surveillance colonoscopy 2025 Severe nausea with prior bowel prep   PLAN: - Repeat fecal calprotectin, if positive proceed with CTE - Pancreatic elastase - Breath test for bacterial overgrowth  - Add a stool bulking agent such as Metamucil or the supplements that she brings - Increased colestipol 1 g QAM BID - Avoid dairy products, raw fruits, raw vegetables, high fat foods, artifical sweeteners, and carbonated beverages until symptoms resolve - Obtain recent labs from Dr. Lindell Noe - Office follow-up 2-4 weeks after the breath test is submitted - Surveillance colonoscopy 2025   Please see the "Patient Instructions" section for addition details about the plan.  HPI: Rachel Nixon is a 66 y.o. female last seen 09/04/21 for chronic diarrhea.  The interval history is obtained through the patient and review of her electronic health record.  She has IBS, reflux, obesity, type 2 diabetes, hypercholesterolemia, migraines, hypertension, vitamin D deficiency, depression, and chronic fatigue. She reports a normal TSH on thyroid supplements.   At the time of her initial consultation 07/05/21 she was reporting a one year history of intermittent explosive watery diarrhea. Initially developed when she started using metformin. Having 1-4 BM daily without blood or mucous. May have some associated abdominal pain. Requires Imodium to leave the  house and has been wearing undergarments. Has had accidents. No nocturnal symptoms.  Not related to eating. She was diagnosed with IBS years ago and intermittently had diarrhea associated with that but those symptoms were not nearly as severe. No change in symptoms despite discontinuation of metformin. Was drinking a Diet Coke every afternoon. Using Stevia. No identified food triggers. No alarm features.    Evaluation: - Colonoscopy with normal random colon biopsies 07/19/21 - TTGA and IgA normal - Giardia negative - Fecal calprotectin 166  Labs 12/18/20 showed normal liver enzymes and normal CBC except for RBC 5.21  Returns today in follow-up with persistent diarrhea with associated lower abdominal pain. She has a list of questions based on information that she has read on the internet. She brings samples of 3 supplements that she has considered trying. She has had some accidents. No nocturnal symptoms. No blood or mucous in the stool. Last solid bowel movement was Monday.   Treatment trials: - PeptoBismal with no improvement - no changes avoiding milk, yogurt, or cheese - Imodium every 3-4 days controls her diarrhea - Dicyclomine did not help and she didn't like it - Some improvement with colestipol  No prior abdominal imaging.  Prior endoscopic history: - Colonoscopy 03/2002 with Dr. Watt Climes - ? Colonoscopy with Dr. Collene Mares in 2009 - Colonoscopy in 2012 for retrieval of a crown. Performed by a Psychologist, sport and exercise. No polyps seen. Colon noted to be tortuous.  - EGD 02/03/2013 with Dr. Watt Climes for reflux: Medium hiatal hernia, otherwise normal - Colonoscopy 02/05/13 with Dr. Watt Climes: Diminutive hyperplastic polyp, benign polypoid mucosa, internal and external hemorrhoids - Colonoscopy 07/19/21. Three  tubular adenomas, hyperplastic polyps, normal right and left sided colon biopsies.    No known family history of colon cancer or polyps. No family history of uterine/endometrial cancer, pancreatic cancer or  gastric/stomach cancer.   Past Medical History:  Diagnosis Date   Allergy    Anxiety    Arthritis    hips, knees, hands   Depression    Diabetes mellitus without complication (HCC)    GERD (gastroesophageal reflux disease)    Hyperlipidemia    Hypertension    Hypothyroidism    Inappropriate sinus tachycardia 07/29/2016   Migraine headache    Obesity     Past Surgical History:  Procedure Laterality Date   ABDOMINAL HYSTERECTOMY     BREAST BIOPSY Right 06/12/2016   Procedure: RIGHT BREAST BIOPSY WITH NEEDLE LOCALIZATION;  Surgeon: Rolm Bookbinder, MD;  Location: [facility];  Service: General;  Laterality: Right;  RIGHT BREAST BIOPSY WITH NEEDLE LOCALIZATION   BREAST BIOPSY Left    No noticable scars anymore    BREAST EXCISIONAL BIOPSY Right 2017   BREAST EXCISIONAL BIOPSY Left over 10 years   no visible scar   CHOLECYSTECTOMY     FRACTURE SURGERY     RADIOACTIVE SEED GUIDED EXCISIONAL BREAST BIOPSY Right 06/12/2016   Procedure: RIGHT RADIOACTIVE SEED GUIDED EXCISIONAL BREAST BIOPSY X 2;  Surgeon: Rolm Bookbinder, MD;  Location: [place];  Service: General;  Laterality: Right;  RIGHT RADIOACTIVE SEED GUIDED EXCISIONAL BREAST BIOPSY X 2   TONSILLECTOMY      Current Outpatient Medications  Medication Sig Dispense Refill   atorvastatin (LIPITOR) 20 MG tablet Take 20 mg by mouth daily.     Cholecalciferol (VITAMIN D3) 3000 units TABS Take 3,000 Units by mouth 3 (three) times a week.      colestipol (COLESTID) 1 g tablet TAKE 1 TABLET (1 G TOTAL) BY MOUTH EVERY MORNING. 90 tablet 3   diltiazem (CARDIZEM CD) 120 MG 24 hr capsule Take 1 capsule (120 mg total) by mouth at bedtime. Please keep your upcoming appointment with Laurann Montana, NP for refills. 90 capsule 3   ergocalciferol (VITAMIN D2) 50000 UNITS capsule Take 50,000 Units by mouth 2 (two) times a week. Sunday and thursday     glipiZIDE (GLUCOTROL) 5 MG tablet Take by mouth daily before  breakfast.     levothyroxine (SYNTHROID) 50 MCG tablet Take 50 mcg by mouth daily.     Multiple Vitamin (MULTIVITAMIN WITH MINERALS) TABS tablet Take 1 tablet by mouth daily.     omeprazole (PRILOSEC) 10 MG capsule Take 10 mg by mouth daily.     ondansetron (ZOFRAN) 4 MG tablet Please take as directed when completing colonoscopy prep 10 tablet 1   propranolol (INDERAL) 20 MG tablet Take 1 tablet (20 mg total) by mouth 2 (two) times daily. 180 tablet 3   sertraline (ZOLOFT) 50 MG tablet Take 50 mg by mouth daily.     Cyanocobalamin (VITAMIN B12) 1000 MCG TBCR Take 1,000 tablets by mouth 2 (two) times a week. (Patient not taking: Reported on 08/14/2022)     Current Facility-Administered Medications  Medication Dose Route Frequency Provider Last Rate Last Admin   0.9 %  sodium chloride infusion  500 mL Intravenous Once Thornton Park, MD        Allergies as of 08/14/2022 - Review Complete 08/14/2022  Allergen Reaction Noted   Erythromycin Nausea Only 12/22/2012   Etodolac Hives 12/22/2012   Metoprolol  11/15/2019    Family History  Problem Relation Age of Onset   CAD Father    Diabetes type II Brother    Cancer Other    Hypertension Other    COPD Other    Hyperlipidemia Other    Diabetes Other    Stroke Other    Heart disease Other    Dementia Other    Breast cancer Maternal Grandmother    Alzheimer's disease Maternal Grandfather    Lymphoma Paternal Grandmother    Heart disease Paternal Grandfather       Physical Exam: Vital signs were reviewed. General:   Alert, well-nourished, pleasant and cooperative in NAD.  No pallor. Head:  Normocephalic and atraumatic.  No alopecia. Eyes:  Sclera clear, no icterus.   Conjunctiva pink. Abdomen:  Soft, nontender, normal bowel sounds. No rebound or guarding. No hepatosplenomegaly Neurologic:  Alert and  oriented x4;  grossly nonfocal Skin:  No rash or bruise. Psych:  Alert and cooperative. Normal mood and affect.   I spent 30  minutes, including in depth chart review, independent review of results, face-to-face time with the patient, coordinating care, and ordering studies and medications as appropriate, and documentation.    Ariel Wingrove L. Tarri Glenn, MD, MPH 08/14/2022, 11:15 AM

## 2022-08-14 NOTE — Patient Instructions (Addendum)
It was my pleasure to provide care to you today. Based on our discussion, I am providing you with my recommendations below:  RECOMMENDATION(S):   We discussed some additional testing with stool studies and a breath test to further investigate your diarrhea.  Add a daily stool bulking agent such as Metamucil or the supplements that you brought today.  Please increase your colestipol to 1 g twice daily. If this does not make things tolerable, we have other treatment options. In fact, this is still a very low dose.   Avoid dairy products, raw fruits, raw vegetables, high fat foods, artifical sweeteners, and carbonated beverages until symptoms resolve.  You have been given a testing kit to check for small intestine bacterial overgrowth (SIBO) which is completed by a company named [employer]. Make sure to return your test in the mail using the return mailing label given to you along with the kit. Your demographic and insurance information have already been sent to the company and they should be in contact with you over the next 1-2 weeks regarding this test. [facility] will collect an upfront charge of $99.74 for commercial insurance plans and $209.74 is you are paying cash. Make sure to discuss with [facility] PRIOR to having the test to see if they have gotten information from your insurance company as to how much your testing will cost out of pocket, if any. Please keep in mind that you will be getting a call from phone number [phone] or a similar number. If you do not hear from them within this time frame, please call our office at [phone] or call [employer] directly at [phone].    FOLLOW UP:  Please follow-up with me by MyChart in 2 weeks.   BMI:  If you are age 29 or older, your body mass index should be between 23-30. Your Body mass index is 40.76 kg/m. If this is out of the aforementioned range listed, please consider follow up with your Primary Care  Provider.  If you are age 19 or younger, your body mass index should be between 19-25. Your Body mass index is 40.76 kg/m. If this is out of the aformentioned range listed, please consider follow up with your Primary Care Provider.   MY CHART:  The [facility] providers would like to encourage you to use [facility] to communicate with providers for non-urgent requests or questions.  Due to long hold times on the telephone, sending your provider a message by [facility] may be a faster and more efficient way to get a response.  Please allow 48 business hours for a response.  Please remember that this is for non-urgent requests.   Thank you for trusting me with your gastrointestinal care!    Thornton Park, MD, MPH

## 2022-08-21 ENCOUNTER — Other Ambulatory Visit: Payer: Medicare Other

## 2022-08-23 ENCOUNTER — Other Ambulatory Visit: Payer: Medicare Other

## 2022-08-23 DIAGNOSIS — K529 Noninfective gastroenteritis and colitis, unspecified: Secondary | ICD-10-CM

## 2022-08-23 DIAGNOSIS — R1030 Lower abdominal pain, unspecified: Secondary | ICD-10-CM

## 2022-08-23 DIAGNOSIS — R197 Diarrhea, unspecified: Secondary | ICD-10-CM

## 2022-08-27 LAB — CALPROTECTIN, FECAL: Calprotectin, Fecal: 62 ug/g (ref 0–120)

## 2022-08-30 LAB — PANCREATIC ELASTASE, FECAL: Pancreatic Elastase-1, Stool: >500 mcg/g

## 2022-08-31 ENCOUNTER — Encounter: Payer: Self-pay | Admitting: Gastroenterology

## 2022-09-05 ENCOUNTER — Encounter: Payer: Self-pay | Admitting: *Deleted

## 2022-09-09 ENCOUNTER — Ambulatory Visit: Payer: Medicare Other | Admitting: Psychiatry

## 2022-10-16 ENCOUNTER — Ambulatory Visit: Payer: Medicare Other | Admitting: Gastroenterology

## 2022-10-22 DIAGNOSIS — E119 Type 2 diabetes mellitus without complications: Secondary | ICD-10-CM | POA: Diagnosis not present

## 2023-02-25 ENCOUNTER — Other Ambulatory Visit (HOSPITAL_BASED_OUTPATIENT_CLINIC_OR_DEPARTMENT_OTHER): Payer: Self-pay | Admitting: Family

## 2023-02-25 DIAGNOSIS — I10 Essential (primary) hypertension: Secondary | ICD-10-CM

## 2023-02-25 NOTE — Telephone Encounter (Signed)
Message sent to scheduling for appointment for refills.

## 2023-02-25 NOTE — Telephone Encounter (Signed)
Please call pt to schedule overdue 1 year follow-up appointment with Dr. Oval Linsey or APP for refill of Diltiazem and Propranolol. Thank you!

## 2023-02-27 NOTE — Telephone Encounter (Signed)
Rx(s) sent to pharmacy electronically.  

## 2023-02-28 ENCOUNTER — Other Ambulatory Visit: Payer: Self-pay | Admitting: Adult Health

## 2023-02-28 DIAGNOSIS — Z1231 Encounter for screening mammogram for malignant neoplasm of breast: Secondary | ICD-10-CM

## 2023-02-28 NOTE — Telephone Encounter (Signed)
Rx request sent to pharmacy.  

## 2023-03-13 ENCOUNTER — Ambulatory Visit (HOSPITAL_BASED_OUTPATIENT_CLINIC_OR_DEPARTMENT_OTHER): Payer: Medicare Other | Admitting: Cardiovascular Disease

## 2023-04-02 ENCOUNTER — Other Ambulatory Visit (HOSPITAL_BASED_OUTPATIENT_CLINIC_OR_DEPARTMENT_OTHER): Payer: Self-pay | Admitting: Family

## 2023-04-02 DIAGNOSIS — I10 Essential (primary) hypertension: Secondary | ICD-10-CM

## 2023-04-02 NOTE — Telephone Encounter (Signed)
Rx(s) sent to pharmacy electronically.  

## 2023-04-13 ENCOUNTER — Other Ambulatory Visit (HOSPITAL_BASED_OUTPATIENT_CLINIC_OR_DEPARTMENT_OTHER): Payer: Self-pay | Admitting: Family

## 2023-04-13 DIAGNOSIS — I10 Essential (primary) hypertension: Secondary | ICD-10-CM

## 2023-04-14 NOTE — Telephone Encounter (Signed)
Rx request sent to pharmacy.  

## 2023-04-29 ENCOUNTER — Ambulatory Visit: Payer: Medicare Other

## 2023-05-22 ENCOUNTER — Other Ambulatory Visit: Payer: Self-pay

## 2023-05-22 DIAGNOSIS — I10 Essential (primary) hypertension: Secondary | ICD-10-CM

## 2023-05-22 MED ORDER — DILTIAZEM HCL ER COATED BEADS 120 MG PO CP24: 120.0000 mg | ORAL_CAPSULE | Freq: Every day | ORAL | 0 refills | Status: DC

## 2023-05-27 ENCOUNTER — Ambulatory Visit (HOSPITAL_BASED_OUTPATIENT_CLINIC_OR_DEPARTMENT_OTHER): Payer: Medicare HMO | Admitting: Family

## 2023-06-03 ENCOUNTER — Ambulatory Visit: Payer: Medicare Other

## 2023-06-09 ENCOUNTER — Other Ambulatory Visit: Payer: Self-pay | Admitting: Family Medicine

## 2023-06-09 DIAGNOSIS — Z1231 Encounter for screening mammogram for malignant neoplasm of breast: Secondary | ICD-10-CM

## 2023-06-11 ENCOUNTER — Ambulatory Visit
Admission: RE | Admit: 2023-06-11 | Discharge: 2023-06-11 | Disposition: A | Discharge status: Home / Self Care | Payer: Medicare HMO | Source: Ambulatory Visit

## 2023-06-11 DIAGNOSIS — Z1231 Encounter for screening mammogram for malignant neoplasm of breast: Secondary | ICD-10-CM | POA: Diagnosis not present

## 2023-06-17 ENCOUNTER — Other Ambulatory Visit: Payer: Self-pay | Admitting: Cardiovascular Disease

## 2023-06-17 ENCOUNTER — Ambulatory Visit (INDEPENDENT_AMBULATORY_CARE_PROVIDER_SITE_OTHER): Payer: Medicare HMO | Admitting: Family

## 2023-06-17 ENCOUNTER — Encounter (HOSPITAL_BASED_OUTPATIENT_CLINIC_OR_DEPARTMENT_OTHER): Payer: Self-pay | Admitting: Family

## 2023-06-17 VITALS — BP 110/78 | HR 85 | Ht 63.0 in | Wt 226.3 lb

## 2023-06-17 DIAGNOSIS — I10 Essential (primary) hypertension: Secondary | ICD-10-CM

## 2023-06-17 DIAGNOSIS — I4711 Inappropriate sinus tachycardia, so stated: Secondary | ICD-10-CM | POA: Diagnosis not present

## 2023-06-17 MED ORDER — DILTIAZEM HCL ER COATED BEADS 120 MG PO CP24: 120.0000 mg | ORAL_CAPSULE | Freq: Every day | ORAL | 3 refills | Status: AC

## 2023-06-17 MED ORDER — PROPRANOLOL HCL 20 MG PO TABS
20.0000 mg | ORAL_TABLET | Freq: Two times a day (BID) | ORAL | 3 refills | Status: AC
Start: 2023-06-17 — End: ?

## 2023-06-17 NOTE — Progress Notes (Signed)
  Cardiology Office Note:  .   Date:  06/17/2023  ID:  JLYNN LY, DOB 02/22/1956, MRN [id number] PCP: Shon Hale, MD  [facility] Providers Cardiologist:  Chilton Si, MD    History of Present Illness: .   Rachel Nixon is a 67 y.o. female with a hx of hypertension, hyperlipidemia, atypical ductal hyperplasia, diabetes mellitus, GERD, obesity, inappropriate sinus tachycardia last seen 02/11/22.   She wore a monitor 08/2019 revealing predominantly normal sinus rhythm with average heart rate 98 bpm and up to 5 beats of NSVT noted.  At follow-up 11/2019 she was started on propanolol.  At follow-up 12/2019 she noted improvement in symptoms. Last seen 02/11/22 doing well from a cardiac perspective.    She presents today for follow up. She is doing well and exercising by walking occasionally. Endorses following overall heart healthy diet. Reports no shortness of breath nor dyspnea on exertion. Reports no chest pain, pressure, or tightness. No edema, orthopnea, PND. Reports no palpitations.  Her BP has been well controlled by home monitoring.     ROS: Please see the history of present illness.    All other systems reviewed and are negative.   Studies Reviewed: .        Cardiac Studies & Procedures         MONITORS  LONG TERM MONITOR (3-14 DAYS) 09/02/2019  Narrative 48 Hour Zio Monitor  Quality: Fair.  Baseline artifact. Predominant rhythm: sinus rhythm Average heart rate: 98 bpm Max heart rate: 148 bpm Min heart rate: 72 bpm  Up to 5 beats of SVT noted.  Tiffany C. Duke Salvia, MD, [facility] 09/02/2019 3:46 PM           Risk Assessment/Calculations:             Physical Exam:   VS:  BP 110/78 (BP Location: Left Arm, Patient Position: Sitting, Cuff Size: Large)   Pulse 85   Ht 5\' 3"  (1.6 m)   Wt 226 lb 4.8 oz (102.6 kg)   BMI 40.09 kg/m    Wt Readings from Last 3 Encounters:  06/17/23 226 lb 4.8 oz (102.6 kg)  08/14/22 230 lb 2 oz (104.4 kg)  02/11/22  225 lb 8 oz (102.3 kg)    GEN: Well nourished, well developed in no acute distress NECK: No JVD; No carotid bruits CARDIAC: RRR, no murmurs, rubs, gallops RESPIRATORY:  Clear to auscultation without rales, wheezing or rhonchi  ABDOMEN: Soft, non-tender, non-distended EXTREMITIES:  No edema; No deformity   ASSESSMENT AND PLAN: .    Inappropriate sinus tachycardia - Well controlled on Diltiazem 120mg  QD and Propranolol 20mg  QD. Refill provided.    Hypertension - BP well controlled. Continue current antihypertensive regimen. Discussed to monitor BP at home at least 2 hours after medications and sitting for 5-10 minutes.    Situational anxiety - Continue to follow with PCP.    GERD - Well controlled. Follows with GI and PCP.    Obesity - Congratulated on weight loss. Weight loss via diet and exercise encouraged. Discussed the impact being overweight would have on cardiovascular risk.        Dispo: follow up in one year  Signed, Alver Sorrow, NP

## 2023-06-17 NOTE — Patient Instructions (Addendum)
Medication Instructions:  Your physician recommends that you continue on your current medications as directed. Please refer to the Current Medication list given to you today.  *If you need a refill on your cardiac medications before your next appointment, please call your pharmacy*  Follow-Up: At [facility], you and your health needs are our priority.  As part of our continuing mission to provide you with exceptional heart care, we have created designated Provider Care Teams.  These Care Teams include your primary Cardiologist (physician) and Advanced Practice Providers (APPs -  Physician Assistants and Nurse Practitioners) who all work together to provide you with the care you need, when you need it.  We recommend signing up for the patient portal called "MyChart".  Sign up information is provided on this After Visit Summary.  MyChart is used to connect with patients for Virtual Visits (Telemedicine).  Patients are able to view lab/test results, encounter notes, upcoming appointments, etc.  Non-urgent messages can be sent to your provider as well.   To learn more about what you can do with MyChart, go to [email or web address].    Your next appointment:   1 year with Dr. Duke Salvia or Gillian Shields, NP   Other Instructions To prevent palpitations: Make sure you are adequately hydrated.  Avoid and/or limit caffeine containing beverages like soda or tea. Exercise regularly.  Manage stress well. Some over the counter medications can cause palpitations such as Benadryl, AdvilPM, TylenolPM. Regular Advil or Tylenol do not cause palpitations.

## 2023-06-22 ENCOUNTER — Encounter (HOSPITAL_BASED_OUTPATIENT_CLINIC_OR_DEPARTMENT_OTHER): Payer: Self-pay | Admitting: Family

## 2023-07-03 DIAGNOSIS — E039 Hypothyroidism, unspecified: Secondary | ICD-10-CM | POA: Diagnosis not present

## 2023-07-03 DIAGNOSIS — E538 Deficiency of other specified B group vitamins: Secondary | ICD-10-CM | POA: Diagnosis not present

## 2023-07-03 DIAGNOSIS — E119 Type 2 diabetes mellitus without complications: Secondary | ICD-10-CM | POA: Diagnosis not present

## 2023-07-03 DIAGNOSIS — I10 Essential (primary) hypertension: Secondary | ICD-10-CM | POA: Diagnosis not present

## 2023-07-03 DIAGNOSIS — E7800 Pure hypercholesterolemia, unspecified: Secondary | ICD-10-CM | POA: Diagnosis not present

## 2023-07-03 DIAGNOSIS — E559 Vitamin D deficiency, unspecified: Secondary | ICD-10-CM | POA: Diagnosis not present

## 2023-07-08 DIAGNOSIS — Z0000 Encounter for general adult medical examination without abnormal findings: Secondary | ICD-10-CM | POA: Diagnosis not present

## 2023-07-08 DIAGNOSIS — R413 Other amnesia: Secondary | ICD-10-CM | POA: Diagnosis not present

## 2023-07-08 DIAGNOSIS — I10 Essential (primary) hypertension: Secondary | ICD-10-CM | POA: Diagnosis not present

## 2023-07-08 DIAGNOSIS — E039 Hypothyroidism, unspecified: Secondary | ICD-10-CM | POA: Diagnosis not present

## 2023-07-08 DIAGNOSIS — E119 Type 2 diabetes mellitus without complications: Secondary | ICD-10-CM | POA: Diagnosis not present

## 2023-07-08 DIAGNOSIS — E7800 Pure hypercholesterolemia, unspecified: Secondary | ICD-10-CM | POA: Diagnosis not present

## 2023-07-08 DIAGNOSIS — H9190 Unspecified hearing loss, unspecified ear: Secondary | ICD-10-CM | POA: Diagnosis not present

## 2023-07-08 DIAGNOSIS — F329 Major depressive disorder, single episode, unspecified: Secondary | ICD-10-CM | POA: Diagnosis not present

## 2023-07-11 ENCOUNTER — Other Ambulatory Visit: Payer: Self-pay | Admitting: Family Medicine

## 2023-07-11 DIAGNOSIS — E2839 Other primary ovarian failure: Secondary | ICD-10-CM

## 2023-09-11 ENCOUNTER — Telehealth: Payer: Self-pay | Admitting: Gastroenterology

## 2023-09-11 DIAGNOSIS — K529 Noninfective gastroenteritis and colitis, unspecified: Secondary | ICD-10-CM

## 2023-09-11 NOTE — Telephone Encounter (Signed)
Patient called to request a refill on Colestipol. Please advise.

## 2023-09-12 MED ORDER — COLESTIPOL HCL 1 G PO TABS
1.0000 g | ORAL_TABLET | Freq: Two times a day (BID) | ORAL | 0 refills | Status: DC
Start: 2023-09-12 — End: 2024-05-12

## 2023-09-12 NOTE — Telephone Encounter (Signed)
Refill sent to pharmacy.   

## 2023-12-17 DIAGNOSIS — L0390 Cellulitis, unspecified: Secondary | ICD-10-CM | POA: Diagnosis not present

## 2023-12-18 DIAGNOSIS — R051 Acute cough: Secondary | ICD-10-CM | POA: Diagnosis not present

## 2023-12-18 DIAGNOSIS — J069 Acute upper respiratory infection, unspecified: Secondary | ICD-10-CM | POA: Diagnosis not present

## 2024-01-07 DIAGNOSIS — E119 Type 2 diabetes mellitus without complications: Secondary | ICD-10-CM | POA: Diagnosis not present

## 2024-01-09 DIAGNOSIS — I10 Essential (primary) hypertension: Secondary | ICD-10-CM | POA: Diagnosis not present

## 2024-01-09 DIAGNOSIS — R0989 Other specified symptoms and signs involving the circulatory and respiratory systems: Secondary | ICD-10-CM | POA: Diagnosis not present

## 2024-01-09 DIAGNOSIS — E7800 Pure hypercholesterolemia, unspecified: Secondary | ICD-10-CM | POA: Diagnosis not present

## 2024-01-09 DIAGNOSIS — E119 Type 2 diabetes mellitus without complications: Secondary | ICD-10-CM | POA: Diagnosis not present

## 2024-01-09 DIAGNOSIS — Z23 Encounter for immunization: Secondary | ICD-10-CM | POA: Diagnosis not present

## 2024-01-09 DIAGNOSIS — F5101 Primary insomnia: Secondary | ICD-10-CM | POA: Diagnosis not present

## 2024-01-11 ENCOUNTER — Other Ambulatory Visit: Payer: Self-pay | Admitting: Gastroenterology

## 2024-01-11 DIAGNOSIS — K529 Noninfective gastroenteritis and colitis, unspecified: Secondary | ICD-10-CM

## 2024-04-21 NOTE — Progress Notes (Deleted)
 Chief Complaint: Primary GI MD: Dr. Savannah Curlin  HPI:  *** is a  ***  who was referred to me by Ransom Byers, * for a complaint of *** .     Discussed the use of AI scribe software for clinical note transcription with the patient, who gave verbal consent to proceed.  History of Present Illness      PREVIOUS GI WORKUP     Past Medical History:  Diagnosis Date   Allergy    Anxiety    Arthritis    hips, knees, hands   Depression    Diabetes mellitus without complication (HCC)    GERD (gastroesophageal reflux disease)    Hyperlipidemia    Hypertension    Hypothyroidism    IBS (irritable bowel syndrome)    Inappropriate sinus tachycardia 07/29/2016   Migraine headache    OAB (overactive bladder)    Obesity     Past Surgical History:  Procedure Laterality Date   ABDOMINAL HYSTERECTOMY     BREAST BIOPSY Right 06/12/2016   Procedure: RIGHT BREAST BIOPSY WITH NEEDLE LOCALIZATION;  Surgeon: Enid Harry, MD;  Location: [facility];  Service: General;  Laterality: Right;  RIGHT BREAST BIOPSY WITH NEEDLE LOCALIZATION   BREAST BIOPSY Left    No noticable scars anymore    BREAST EXCISIONAL BIOPSY Right 2017   BREAST EXCISIONAL BIOPSY Left over 10 years   no visible scar   CHOLECYSTECTOMY     FRACTURE SURGERY     RADIOACTIVE SEED GUIDED EXCISIONAL BREAST BIOPSY Right 06/12/2016   Procedure: RIGHT RADIOACTIVE SEED GUIDED EXCISIONAL BREAST BIOPSY X 2;  Surgeon: Enid Harry, MD;  Location: [facility];  Service: General;  Laterality: Right;  RIGHT RADIOACTIVE SEED GUIDED EXCISIONAL BREAST BIOPSY X 2   TONSILLECTOMY      Current Outpatient Medications  Medication Sig Dispense Refill   atorvastatin  (LIPITOR) 20 MG tablet Take 20 mg by mouth daily.     colestipol  (COLESTID ) 1 g tablet Take 1 tablet (1 g total) by mouth 2 (two) times daily. 180 tablet 0   diltiazem  (CARDIZEM  CD) 120 MG 24 hr capsule Take 1 capsule (120 mg total) by  mouth at bedtime. Please keep your upcoming appointment for refills. 90 capsule 3   ergocalciferol (VITAMIN D2) 50000 UNITS capsule Take 50,000 Units by mouth 2 (two) times a week. Sunday and thursday     glipiZIDE (GLUCOTROL) 5 MG tablet Take by mouth daily before breakfast.     levothyroxine (SYNTHROID) 50 MCG tablet Take 50 mcg by mouth daily.     Multiple Vitamin (MULTIVITAMIN WITH MINERALS) TABS tablet Take 1 tablet by mouth daily.     omeprazole (PRILOSEC) 10 MG capsule Take 10 mg by mouth daily.     propranolol  (INDERAL ) 20 MG tablet Take 1 tablet (20 mg total) by mouth 2 (two) times daily. Please keep your upcoming appointment for refills. 180 tablet 3   sertraline (ZOLOFT) 50 MG tablet Take 50 mg by mouth daily.     Current Facility-Administered Medications  Medication Dose Route Frequency Provider Last Rate Last Admin   0.9 %  sodium chloride  infusion  500 mL Intravenous Once Beavers, Kimberly, MD        Allergies as of 04/22/2024 - Review Complete 06/22/2023  Allergen Reaction Noted   Erythromycin Nausea Only 12/22/2012   Etodolac Hives 12/22/2012   Metoprolol  11/15/2019    Family History  Problem Relation Age of Onset   Lymphoma Mother  Dementia Mother    Diabetes Father    COPD Father    Dementia Father    CAD Father    Diabetes type II Brother    Breast cancer Maternal Grandmother    Alzheimer's disease Maternal Grandfather    Lymphoma Paternal Grandmother    Heart disease Paternal Grandfather    Cancer Other    Hypertension Other    COPD Other    Hyperlipidemia Other    Diabetes Other    Stroke Other    Heart disease Other    Dementia Other     Social History   Socioeconomic History   Marital status: Divorced    Spouse name: Not on file   Number of children: 1   Years of education: Not on file   Highest education level: Not on file  Occupational History   Not on file  Tobacco Use   Smoking status: Never   Smokeless tobacco: Never  Substance  and Sexual Activity   Alcohol use: No   Drug use: No   Sexual activity: Not Currently  Other Topics Concern   Not on file  Social History Narrative   Not on file   Social Drivers of Health   Financial Resource Strain: Not on file  Food Insecurity: Not on file  Transportation Needs: Not on file  Physical Activity: Not on file  Stress: Not on file  Social Connections: Not on file  Intimate Partner Violence: Not on file    Review of Systems:    Constitutional: No weight loss, fever, chills, weakness or fatigue HEENT: Eyes: No change in vision               Ears, Nose, Throat:  No change in hearing or congestion Skin: No rash or itching Cardiovascular: No chest pain, chest pressure or palpitations   Respiratory: No SOB or cough Gastrointestinal: See HPI and otherwise negative Genitourinary: No dysuria or change in urinary frequency Neurological: No headache, dizziness or syncope Musculoskeletal: No new muscle or joint pain Hematologic: No bleeding or bruising Psychiatric: No history of depression or anxiety    Physical Exam:  Vital signs: There were no vitals taken for this visit.  Constitutional: NAD, alert and cooperative Head:  Normocephalic and atraumatic. Eyes:   PEERL, EOMI. No icterus. Conjunctiva pink. Respiratory: Respirations even and unlabored. Lungs clear to auscultation bilaterally.   No wheezes, crackles, or rhonchi.  Cardiovascular:  Regular rate and rhythm. No peripheral edema, cyanosis or pallor.  Gastrointestinal:  Soft, nondistended, nontender. No rebound or guarding. Normal bowel sounds. No appreciable masses or hepatomegaly. Rectal:  Declines Msk:  Symmetrical without gross deformities. Without edema, no deformity or joint abnormality.  Neurologic:  Alert and  oriented x4;  grossly normal neurologically.  Skin:   Dry and intact without significant lesions or rashes. Psychiatric: Oriented to person, place and time. Demonstrates good judgement and  reason without abnormal affect or behaviors.  Physical Exam    RELEVANT LABS AND IMAGING: CBC    Component Value Date/Time   WBC 8.1 12/18/2020 1012   RBC 5.21 (H) 12/18/2020 1012   HGB 12.8 12/18/2020 1012   HGB 12.4 08/31/2018 1249   HGB 13.1 07/24/2016 1615   HCT 41.7 12/18/2020 1012   HCT 40.7 07/24/2016 1615   PLT 260 12/18/2020 1012   PLT 241 08/31/2018 1249   PLT 280 07/24/2016 1615   MCV 80.0 12/18/2020 1012   MCV 78.3 (L) 07/24/2016 1615   MCH 24.6 (L) 12/18/2020 1012  MCHC 30.7 12/18/2020 1012   RDW 15.2 12/18/2020 1012   RDW 14.5 07/24/2016 1615   LYMPHSABS 2.1 12/18/2020 1012   LYMPHSABS 3.1 07/24/2016 1615   MONOABS 0.9 12/18/2020 1012   MONOABS 0.8 07/24/2016 1615   EOSABS 0.2 12/18/2020 1012   EOSABS 0.2 07/24/2016 1615   BASOSABS 0.0 12/18/2020 1012   BASOSABS 0.0 07/24/2016 1615    CMP     Component Value Date/Time   NA 138 12/18/2020 1012   NA 138 07/24/2016 1615   K 4.3 12/18/2020 1012   K 4.3 07/24/2016 1615   CL 105 12/18/2020 1012   CO2 25 12/18/2020 1012   CO2 25 07/24/2016 1615   GLUCOSE 129 (H) 12/18/2020 1012   GLUCOSE 105 07/24/2016 1615   BUN 20 12/18/2020 1012   BUN 14.5 07/24/2016 1615   CREATININE 0.73 12/18/2020 1012   CREATININE 0.73 08/31/2018 1249   CREATININE 0.9 07/24/2016 1615   CALCIUM  9.2 12/18/2020 1012   CALCIUM  9.9 07/24/2016 1615   PROT 6.9 12/18/2020 1012   PROT 7.4 07/24/2016 1615   ALBUMIN 3.4 (L) 12/18/2020 1012   ALBUMIN 3.7 07/24/2016 1615   AST 18 12/18/2020 1012   AST 25 08/31/2018 1249   AST 31 07/24/2016 1615   ALT 18 12/18/2020 1012   ALT 30 08/31/2018 1249   ALT 35 07/24/2016 1615   ALKPHOS 113 12/18/2020 1012   ALKPHOS 131 07/24/2016 1615   BILITOT 0.5 12/18/2020 1012   BILITOT <0.2 (L) 08/31/2018 1249   BILITOT 0.36 07/24/2016 1615   GFRNONAA >60 12/18/2020 1012   GFRNONAA >60 08/31/2018 1249   GFRAA >60 10/25/2019 0942   GFRAA >60 08/31/2018 1249     Assessment/Plan:   Assessment  and Plan Assessment & Plan        [facility] 04/21/2024, 9:00 AM  Cc: Ransom Byers, *

## 2024-04-22 ENCOUNTER — Ambulatory Visit: Admitting: Gastroenterology

## 2024-05-12 ENCOUNTER — Telehealth: Payer: Self-pay | Admitting: Gastroenterology

## 2024-05-12 DIAGNOSIS — K529 Noninfective gastroenteritis and colitis, unspecified: Secondary | ICD-10-CM

## 2024-05-12 MED ORDER — COLESTIPOL HCL 1 G PO TABS
1.0000 g | ORAL_TABLET | Freq: Two times a day (BID) | ORAL | 0 refills | Status: DC
Start: 2024-05-12 — End: 2024-05-20

## 2024-05-12 NOTE — Telephone Encounter (Signed)
 Rachel Nixon- Please advise... patient last saw Dr Savannah Curlin 08/14/22. Looks like she was given temporary refill of colestipol  09/12/23 (3 month rx). It doesn't look like she has been taking the medication since January 2025 (based on refills)

## 2024-05-12 NOTE — Telephone Encounter (Signed)
 Rx sent.

## 2024-05-12 NOTE — Telephone Encounter (Signed)
 Patient called and stated that she is scheduled to see [facility] on June the 12 in order for her to receive refill for her colestipol  1G. Patient stated that she she does not have any more medication is she able to get temporary refills. Patient is requesting a call back. Please advise.

## 2024-05-20 ENCOUNTER — Ambulatory Visit: Admitting: Gastroenterology

## 2024-05-20 ENCOUNTER — Encounter: Payer: Self-pay | Admitting: Gastroenterology

## 2024-05-20 VITALS — BP 120/68 | HR 68 | Ht 63.0 in | Wt 228.0 lb

## 2024-05-20 DIAGNOSIS — K529 Noninfective gastroenteritis and colitis, unspecified: Secondary | ICD-10-CM

## 2024-05-20 DIAGNOSIS — Z860100 Personal history of colon polyps, unspecified: Secondary | ICD-10-CM | POA: Diagnosis not present

## 2024-05-20 DIAGNOSIS — K580 Irritable bowel syndrome with diarrhea: Secondary | ICD-10-CM | POA: Diagnosis not present

## 2024-05-20 MED ORDER — COLESTIPOL HCL 1 G PO TABS
1.0000 g | ORAL_TABLET | Freq: Two times a day (BID) | ORAL | 1 refills | Status: AC
Start: 2024-05-20 — End: ?

## 2024-05-20 NOTE — Patient Instructions (Signed)
 It has been recommended to you by your physician that you have a(n) Colonoscopy completed. Per your request, we did not schedule the procedure(s) today. Please contact our office at [phone] should you decide to have the procedure completed. You will be scheduled for a pre-visit and procedure at that time.   _______________________________________________________  If your blood pressure at your visit was 140/90 or greater, please contact your primary care physician to follow up on this.  _______________________________________________________  If you are age 66 or older, your body mass index should be between 23-30. Your Body mass index is 40.39 kg/m. If this is out of the aforementioned range listed, please consider follow up with your Primary Care Provider.  If you are age 38 or younger, your body mass index should be between 19-25. Your Body mass index is 40.39 kg/m. If this is out of the aformentioned range listed, please consider follow up with your Primary Care Provider.   ________________________________________________________  The [place] [place] [facility] providers would like to encourage you to use MYCHART to communicate with providers for non-urgent requests or questions.  Due to long hold times on the telephone, sending your provider a message by [facility] may be a faster and more efficient way to get a response.  Please allow 48 business hours for a response.  Please remember that this is for non-urgent requests.  _______________________________________________________   It was a pleasure to see you today!  Thank you for trusting me with your gastrointestinal care!

## 2024-05-20 NOTE — Progress Notes (Signed)
 Chief Complaint: Med refill Primary GI MD: Dr. Savannah Curlin  HPI:  68 year old female with history as below presents for med refill.   Previous patient of Dr. Savannah Curlin had chronic diarrhea with extensive workup as below ultimately resolved on colestipol  1 tablet twice daily.  She is here for medication refill.  Still doing well on colestipol .  Notes if she forgets to take her evening dose she will have return of her diarrhea.  She is postcholecystectomy.    No weight loss, change in bowel habits, rectal bleeding, family history of colon cancer.   PREVIOUS GI WORKUP    Prior endoscopic history: - Colonoscopy 03/2002 with Dr. Lavaughn Portland - ? Colonoscopy with Dr. Tova Fresh in 2009 - Colonoscopy in 2012 for retrieval of a crown. Performed by a Careers adviser. No polyps seen. Colon noted to be tortuous.  - EGD 02/03/2013 with Dr. Lavaughn Portland for reflux: Medium hiatal hernia, otherwise normal - Colonoscopy 02/05/13 with Dr. Lavaughn Portland: Diminutive hyperplastic polyp, benign polypoid mucosa, internal and external hemorrhoids - Colonoscopy 07/19/21. Three tubular adenomas (10mm 7mm, 5mm), hyperplastic polyps, normal right and left sided colon biopsies.     Past Medical History:  Diagnosis Date   Allergy    Anxiety    Arthritis    hips, knees, hands   Depression    Diabetes mellitus without complication (HCC)    GERD (gastroesophageal reflux disease)    Hyperlipidemia    Hypertension    Hypothyroidism    IBS (irritable bowel syndrome)    Inappropriate sinus tachycardia (HCC) 07/29/2016   Migraine headache    OAB (overactive bladder)    Obesity     Past Surgical History:  Procedure Laterality Date   ABDOMINAL HYSTERECTOMY     BREAST BIOPSY Right 06/12/2016   Procedure: RIGHT BREAST BIOPSY WITH NEEDLE LOCALIZATION;  Surgeon: Enid Harry, MD;  Location: [facility];  Service: General;  Laterality: Right;  RIGHT BREAST BIOPSY WITH NEEDLE LOCALIZATION   BREAST BIOPSY Left    No noticable scars  anymore    BREAST EXCISIONAL BIOPSY Right 2017   BREAST EXCISIONAL BIOPSY Left over 10 years   no visible scar   CHOLECYSTECTOMY     FRACTURE SURGERY     RADIOACTIVE SEED GUIDED EXCISIONAL BREAST BIOPSY Right 06/12/2016   Procedure: RIGHT RADIOACTIVE SEED GUIDED EXCISIONAL BREAST BIOPSY X 2;  Surgeon: Enid Harry, MD;  Location: [facility];  Service: General;  Laterality: Right;  RIGHT RADIOACTIVE SEED GUIDED EXCISIONAL BREAST BIOPSY X 2   TONSILLECTOMY      Current Outpatient Medications  Medication Sig Dispense Refill   atorvastatin  (LIPITOR) 20 MG tablet Take 20 mg by mouth daily.     diltiazem  (CARDIZEM  CD) 120 MG 24 hr capsule Take 1 capsule (120 mg total) by mouth at bedtime. Please keep your upcoming appointment for refills. 90 capsule 3   ergocalciferol (VITAMIN D2) 50000 UNITS capsule Take 50,000 Units by mouth 2 (two) times a week. Sunday and thursday     glipiZIDE (GLUCOTROL) 5 MG tablet Take by mouth daily before breakfast. (Patient taking differently: Take 10 mg by mouth daily before breakfast.)     levothyroxine (SYNTHROID) 50 MCG tablet Take 50 mcg by mouth daily.     Multiple Vitamin (MULTIVITAMIN WITH MINERALS) TABS tablet Take 1 tablet by mouth daily.     omeprazole (PRILOSEC) 10 MG capsule Take 10 mg by mouth daily.     propranolol  (INDERAL ) 20 MG tablet Take 1 tablet (20 mg total)  by mouth 2 (two) times daily. Please keep your upcoming appointment for refills. 180 tablet 3   sertraline (ZOLOFT) 50 MG tablet Take 50 mg by mouth daily.     colestipol  (COLESTID ) 1 g tablet Take 1 tablet (1 g total) by mouth 2 (two) times daily. MUST KEEP UPCOMING 05/2024 APPT FOR FURTHER REFILLS. 180 tablet 1   Current Facility-Administered Medications  Medication Dose Route Frequency Provider Last Rate Last Admin   0.9 %  sodium chloride  infusion  500 mL Intravenous Once Lindle Rhea, MD        Allergies as of 05/20/2024 - Review Complete 05/20/2024  Allergen  Reaction Noted   Erythromycin Nausea Only 12/22/2012   Etodolac Hives 12/22/2012   Metoprolol  11/15/2019    Family History  Problem Relation Age of Onset   Lymphoma Mother    Dementia Mother    Diabetes Father    COPD Father    Dementia Father    CAD Father    Diabetes type II Brother    Breast cancer Maternal Grandmother    Alzheimer's disease Maternal Grandfather    Lymphoma Paternal Grandmother    Heart disease Paternal Grandfather    Cancer Other    Hypertension Other    COPD Other    Hyperlipidemia Other    Diabetes Other    Stroke Other    Heart disease Other    Dementia Other     Social History   Socioeconomic History   Marital status: Divorced    Spouse name: Not on file   Number of children: 1   Years of education: Not on file   Highest education level: Not on file  Occupational History   Not on file  Tobacco Use   Smoking status: Never   Smokeless tobacco: Never  Substance and Sexual Activity   Alcohol use: No   Drug use: No   Sexual activity: Not Currently  Other Topics Concern   Not on file  Social History Narrative   Not on file   Social Drivers of Health   Financial Resource Strain: Not on file  Food Insecurity: Not on file  Transportation Needs: Not on file  Physical Activity: Not on file  Stress: Not on file  Social Connections: Not on file  Intimate Partner Violence: Not on file    Review of Systems:    Constitutional: No weight loss, fever, chills, weakness or fatigue HEENT: Eyes: No change in vision               Ears, Nose, Throat:  No change in hearing or congestion Skin: No rash or itching Cardiovascular: No chest pain, chest pressure or palpitations   Respiratory: No SOB or cough Gastrointestinal: See HPI and otherwise negative Genitourinary: No dysuria or change in urinary frequency Neurological: No headache, dizziness or syncope Musculoskeletal: No new muscle or joint pain Hematologic: No bleeding or  bruising Psychiatric: No history of depression or anxiety    Physical Exam:  Vital signs: BP 120/68   Pulse 68   Ht 5' 3 (1.6 m)   Wt 228 lb (103.4 kg)   BMI 40.39 kg/m   Constitutional: NAD, alert and cooperative Head:  Normocephalic and atraumatic. Eyes:   PEERL, EOMI. No icterus. Conjunctiva pink. Respiratory: Respirations even and unlabored. Lungs clear to auscultation bilaterally.   No wheezes, crackles, or rhonchi.  Cardiovascular:  Regular rate and rhythm. No peripheral edema, cyanosis or pallor.  Gastrointestinal:  Soft, nondistended, nontender. No rebound or guarding.  Normal bowel sounds. No appreciable masses or hepatomegaly. Rectal:  Declines Msk:  Symmetrical without gross deformities. Without edema, no deformity or joint abnormality.  Neurologic:  Alert and  oriented x4;  grossly normal neurologically.  Skin:   Dry and intact without significant lesions or rashes. Psychiatric: Oriented to person, place and time. Demonstrates good judgement and reason without abnormal affect or behaviors.  RELEVANT LABS AND IMAGING: CBC    Component Value Date/Time   WBC 8.1 12/18/2020 1012   RBC 5.21 (H) 12/18/2020 1012   HGB 12.8 12/18/2020 1012   HGB 12.4 08/31/2018 1249   HGB 13.1 07/24/2016 1615   HCT 41.7 12/18/2020 1012   HCT 40.7 07/24/2016 1615   PLT 260 12/18/2020 1012   PLT 241 08/31/2018 1249   PLT 280 07/24/2016 1615   MCV 80.0 12/18/2020 1012   MCV 78.3 (L) 07/24/2016 1615   MCH 24.6 (L) 12/18/2020 1012   MCHC 30.7 12/18/2020 1012   RDW 15.2 12/18/2020 1012   RDW 14.5 07/24/2016 1615   LYMPHSABS 2.1 12/18/2020 1012   LYMPHSABS 3.1 07/24/2016 1615   MONOABS 0.9 12/18/2020 1012   MONOABS 0.8 07/24/2016 1615   EOSABS 0.2 12/18/2020 1012   EOSABS 0.2 07/24/2016 1615   BASOSABS 0.0 12/18/2020 1012   BASOSABS 0.0 07/24/2016 1615    CMP     Component Value Date/Time   NA 138 12/18/2020 1012   NA 138 07/24/2016 1615   K 4.3 12/18/2020 1012   K 4.3  07/24/2016 1615   CL 105 12/18/2020 1012   CO2 25 12/18/2020 1012   CO2 25 07/24/2016 1615   GLUCOSE 129 (H) 12/18/2020 1012   GLUCOSE 105 07/24/2016 1615   BUN 20 12/18/2020 1012   BUN 14.5 07/24/2016 1615   CREATININE 0.73 12/18/2020 1012   CREATININE 0.73 08/31/2018 1249   CREATININE 0.9 07/24/2016 1615   CALCIUM  9.2 12/18/2020 1012   CALCIUM  9.9 07/24/2016 1615   PROT 6.9 12/18/2020 1012   PROT 7.4 07/24/2016 1615   ALBUMIN 3.4 (L) 12/18/2020 1012   ALBUMIN 3.7 07/24/2016 1615   AST 18 12/18/2020 1012   AST 25 08/31/2018 1249   AST 31 07/24/2016 1615   ALT 18 12/18/2020 1012   ALT 30 08/31/2018 1249   ALT 35 07/24/2016 1615   ALKPHOS 113 12/18/2020 1012   ALKPHOS 131 07/24/2016 1615   BILITOT 0.5 12/18/2020 1012   BILITOT <0.2 (L) 08/31/2018 1249   BILITOT 0.36 07/24/2016 1615   GFRNONAA >60 12/18/2020 1012   GFRNONAA >60 08/31/2018 1249   GFRAA >60 10/25/2019 0942   GFRAA >60 08/31/2018 1249     Assessment/Plan:   Chronic diarrhea/IBS Post cholecystectomy Colonoscopy with normal random colon biopsies 07/19/21 TTG and IgA normal Giardia negative Fecal calprotectin 166 01/2021 Resolved on colestipol  two tabs daily -- refill colestipol  90 days x 1 year -- follow up as needed  History of colon polyps  3 tubular adenomas (10mm, 7mm, 50mm)removed on colonoscopy 07/19/21 and noted to have tortuous colon Severe nausea with prior bowel prep -- due 07/2024, patient would like to call us  to schedule -- please do sutab  for bowel prep -- I thoroughly discussed the procedure with the patient (at bedside) to include nature of the procedure, alternatives, benefits, and risks (including but not limited to bleeding, infection, perforation, anesthesia/cardiac pulmonary complications).  Patient verbalized understanding and gave verbal consent to proceed with procedure.   Patient requests female provider. Assigned to Dr. Leonia Raman today   [facility],  PA-C [facility] 05/20/2024, 3:32 PM  Cc: Ransom Byers, *

## 2024-06-07 DIAGNOSIS — E7800 Pure hypercholesterolemia, unspecified: Secondary | ICD-10-CM | POA: Diagnosis not present

## 2024-06-07 DIAGNOSIS — I10 Essential (primary) hypertension: Secondary | ICD-10-CM | POA: Diagnosis not present

## 2024-07-08 DIAGNOSIS — I10 Essential (primary) hypertension: Secondary | ICD-10-CM | POA: Diagnosis not present

## 2024-07-08 DIAGNOSIS — E7800 Pure hypercholesterolemia, unspecified: Secondary | ICD-10-CM | POA: Diagnosis not present

## 2024-08-08 DIAGNOSIS — I10 Essential (primary) hypertension: Secondary | ICD-10-CM | POA: Diagnosis not present

## 2024-08-08 DIAGNOSIS — E7800 Pure hypercholesterolemia, unspecified: Secondary | ICD-10-CM | POA: Diagnosis not present

## 2024-08-18 DIAGNOSIS — E538 Deficiency of other specified B group vitamins: Secondary | ICD-10-CM | POA: Diagnosis not present

## 2024-08-18 DIAGNOSIS — E039 Hypothyroidism, unspecified: Secondary | ICD-10-CM | POA: Diagnosis not present

## 2024-08-18 DIAGNOSIS — Z0000 Encounter for general adult medical examination without abnormal findings: Secondary | ICD-10-CM | POA: Diagnosis not present

## 2024-08-18 DIAGNOSIS — E559 Vitamin D deficiency, unspecified: Secondary | ICD-10-CM | POA: Diagnosis not present

## 2024-08-18 DIAGNOSIS — E7800 Pure hypercholesterolemia, unspecified: Secondary | ICD-10-CM | POA: Diagnosis not present

## 2024-08-18 DIAGNOSIS — E1169 Type 2 diabetes mellitus with other specified complication: Secondary | ICD-10-CM | POA: Diagnosis not present

## 2024-08-20 DIAGNOSIS — E039 Hypothyroidism, unspecified: Secondary | ICD-10-CM | POA: Diagnosis not present

## 2024-08-20 DIAGNOSIS — I10 Essential (primary) hypertension: Secondary | ICD-10-CM | POA: Diagnosis not present

## 2024-08-20 DIAGNOSIS — E559 Vitamin D deficiency, unspecified: Secondary | ICD-10-CM | POA: Diagnosis not present

## 2024-08-20 DIAGNOSIS — E7800 Pure hypercholesterolemia, unspecified: Secondary | ICD-10-CM | POA: Diagnosis not present

## 2024-08-20 DIAGNOSIS — Z0000 Encounter for general adult medical examination without abnormal findings: Secondary | ICD-10-CM | POA: Diagnosis not present

## 2024-08-20 DIAGNOSIS — E538 Deficiency of other specified B group vitamins: Secondary | ICD-10-CM | POA: Diagnosis not present

## 2024-08-20 DIAGNOSIS — E119 Type 2 diabetes mellitus without complications: Secondary | ICD-10-CM | POA: Diagnosis not present

## 2024-08-26 ENCOUNTER — Other Ambulatory Visit (HOSPITAL_BASED_OUTPATIENT_CLINIC_OR_DEPARTMENT_OTHER): Payer: Self-pay | Admitting: Family Medicine

## 2024-08-26 DIAGNOSIS — E2839 Other primary ovarian failure: Secondary | ICD-10-CM

## 2024-08-27 ENCOUNTER — Other Ambulatory Visit: Payer: Self-pay | Admitting: Family Medicine

## 2024-08-27 DIAGNOSIS — Z0000 Encounter for general adult medical examination without abnormal findings: Secondary | ICD-10-CM

## 2024-09-07 DIAGNOSIS — I10 Essential (primary) hypertension: Secondary | ICD-10-CM | POA: Diagnosis not present

## 2024-09-07 DIAGNOSIS — E7800 Pure hypercholesterolemia, unspecified: Secondary | ICD-10-CM | POA: Diagnosis not present

## 2024-09-08 ENCOUNTER — Ambulatory Visit

## 2024-09-14 ENCOUNTER — Ambulatory Visit

## 2024-09-23 DIAGNOSIS — S2001XA Contusion of right breast, initial encounter: Secondary | ICD-10-CM | POA: Diagnosis not present

## 2024-09-23 DIAGNOSIS — S299XXA Unspecified injury of thorax, initial encounter: Secondary | ICD-10-CM | POA: Diagnosis not present

## 2024-09-28 ENCOUNTER — Ambulatory Visit

## 2024-10-14 ENCOUNTER — Ambulatory Visit

## 2024-11-08 ENCOUNTER — Ambulatory Visit
Admission: RE | Admit: 2024-11-08 | Discharge: 2024-11-08 | Disposition: A | Source: Ambulatory Visit | Attending: Family Medicine

## 2024-11-08 DIAGNOSIS — Z0000 Encounter for general adult medical examination without abnormal findings: Secondary | ICD-10-CM

## 2024-12-07 ENCOUNTER — Telehealth: Payer: Self-pay

## 2024-12-07 NOTE — Telephone Encounter (Signed)
 Called and spoke with patient-informed patient that the office is calling to get her scheduled for her recall colon; patient reports she is out of town and will need to call the office back and schedule her colonoscopy; Patient advised to call back to the office at [phone] should questions/concerns arise and to schedule her colonoscopy; Patient verbalized understanding of information/instructions;

## 2025-01-11 ENCOUNTER — Ambulatory Visit: Admitting: Gastroenterology

## 2025-02-02 ENCOUNTER — Ambulatory Visit: Admitting: Gastroenterology

## 2025-03-08 ENCOUNTER — Other Ambulatory Visit (HOSPITAL_BASED_OUTPATIENT_CLINIC_OR_DEPARTMENT_OTHER)
